# Patient Record
Sex: Female | Born: 1995
Health system: Southern US, Community
[De-identification: ages and names within clinical notes are randomized; demographics above are authoritative.]

## PROBLEM LIST (undated history)

## (undated) DIAGNOSIS — Z8489 Family history of other specified conditions: Secondary | ICD-10-CM

## (undated) DIAGNOSIS — E282 Polycystic ovarian syndrome: Secondary | ICD-10-CM

## (undated) DIAGNOSIS — J45909 Unspecified asthma, uncomplicated: Secondary | ICD-10-CM

## (undated) HISTORY — PX: UPPER GI ENDOSCOPY: SHX6162

## (undated) HISTORY — PX: BACK SURGERY: SHX140

---

## 2015-02-10 ENCOUNTER — Emergency Department (HOSPITAL_COMMUNITY): Payer: Medicaid Other

## 2015-02-10 ENCOUNTER — Encounter (HOSPITAL_COMMUNITY): Payer: Self-pay

## 2015-02-10 ENCOUNTER — Emergency Department (HOSPITAL_COMMUNITY)
Admission: EM | Admit: 2015-02-10 | Discharge: 2015-02-11 | Disposition: A | Discharge status: Home / Self Care | Payer: Medicaid Other | Attending: Emergency Medicine | Admitting: Emergency Medicine

## 2015-02-10 DIAGNOSIS — W01198A Fall on same level from slipping, tripping and stumbling with subsequent striking against other object, initial encounter: Secondary | ICD-10-CM | POA: Insufficient documentation

## 2015-02-10 DIAGNOSIS — M25561 Pain in right knee: Secondary | ICD-10-CM

## 2015-02-10 DIAGNOSIS — Y9389 Activity, other specified: Secondary | ICD-10-CM | POA: Diagnosis not present

## 2015-02-10 DIAGNOSIS — Y998 Other external cause status: Secondary | ICD-10-CM | POA: Insufficient documentation

## 2015-02-10 DIAGNOSIS — J45909 Unspecified asthma, uncomplicated: Secondary | ICD-10-CM | POA: Diagnosis not present

## 2015-02-10 DIAGNOSIS — Y9289 Other specified places as the place of occurrence of the external cause: Secondary | ICD-10-CM | POA: Diagnosis not present

## 2015-02-10 DIAGNOSIS — L03011 Cellulitis of right finger: Secondary | ICD-10-CM | POA: Insufficient documentation

## 2015-02-10 DIAGNOSIS — S8991XA Unspecified injury of right lower leg, initial encounter: Secondary | ICD-10-CM | POA: Diagnosis present

## 2015-02-10 HISTORY — DX: Unspecified asthma, uncomplicated: J45.909

## 2015-02-10 NOTE — ED Notes (Addendum)
Pt presents with c/o right knee injury. Pt reports her knee has been hurting for three to four days but today she fell and now her knee is very painful. Ambulatory to triage. Pt also reports right middle finger pain and swelling, denies any injury.

## 2015-02-11 MED ORDER — IBUPROFEN 800 MG PO TABS: 800.0000 mg | ORAL_TABLET | Freq: Three times a day (TID) | ORAL | Status: DC

## 2015-02-11 MED ORDER — HYDROCODONE-ACETAMINOPHEN 5-325 MG PO TABS: 1.0000 | ORAL_TABLET | ORAL | Status: DC | PRN

## 2015-02-11 MED ORDER — CEPHALEXIN 500 MG PO CAPS: 500.0000 mg | ORAL_CAPSULE | Freq: Four times a day (QID) | ORAL | Status: DC

## 2015-02-11 MED ORDER — LIDOCAINE HCL 2 % IJ SOLN
10.0000 mL | Freq: Once | INTRAMUSCULAR | Status: AC
  Administered 2015-02-11: 10 mL via INTRADERMAL
  Filled 2015-02-11: qty 20

## 2015-02-11 NOTE — ED Notes (Signed)
Pt reports R knee pain x 4 days, became worse yesterday when she fell.  Pt also c/o R middle swelling and pain.

## 2015-02-11 NOTE — ED Provider Notes (Signed)
CSN: [id number]     Arrival date & time 02/10/15  2310 History   First MD Initiated Contact with Patient 02/11/15 0009     Chief Complaint  Patient presents with  . Knee Injury  . Pain in finger      (Consider location/radiation/quality/duration/timing/severity/associated sxs/prior Treatment) Patient is a 19 y.o. female presenting with knee pain and hand pain. The history is provided by the patient. No language interpreter was used.  Knee Pain Location:  Knee Knee location:  R knee Associated symptoms: no fever   Associated symptoms comment:  Right knee pain and swelling for several days, made worse today after mechanical fall on to right knee and foot. No history of injury causing initial pain. No similar symptoms previously.  Hand Pain This is a new problem. Pertinent negatives include no chills or fever. Associated symptoms comments: Right middle finger pain and swelling. Pain started around nail base, without drainage or redness, now affecting entire distal finger. No known injury..    Past Medical History  Diagnosis Date  . Asthma    History reviewed. No pertinent past surgical history. No family history on file. History  Substance Use Topics  . Smoking status: Never Smoker   . Smokeless tobacco: Not on file  . Alcohol Use: No   OB History    No data available     Review of Systems  Constitutional: Negative for fever and chills.  Musculoskeletal:       See HPI.  Skin: Negative.  Negative for color change.  Neurological: Negative.       Allergies  Review of patient's allergies indicates no known allergies.  Home Medications   Prior to Admission medications   Not on File   BP 140/107 mmHg  Pulse 99  Temp(Src) 98.5 F (36.9 C) (Oral)  Resp 12  SpO2 100%  LMP 02/10/2015 Physical Exam  Constitutional: She is oriented to person, place, and time. She appears well-developed and well-nourished.  Neck: Normal range of motion.  Pulmonary/Chest: Effort  normal.  Musculoskeletal:  Right knee without significant swelling. No redness or warmth. Increased pain with any range of motion. Ambulatory. Right middle finger has swelling to cuticle without redness or drainage. Tender. Pad of finger soft, tender, without redness.   Neurological: She is alert and oriented to person, place, and time.  Skin: Skin is warm and dry.    ED Course  Procedures (including critical care time) Labs Review Labs Reviewed - No data to display  Imaging Review Dg Knee Complete 4 Views Right  02/11/2015   CLINICAL DATA:  Larey SeatFell down stairs 3-4 days ago, anterior knee and finger pain.  EXAM: RIGHT KNEE - COMPLETE 4+ VIEW  COMPARISON:  None.  FINDINGS: There is no evidence of fracture, dislocation, or joint effusion. There is no evidence of arthropathy or other focal bone abnormality. Soft tissues are unremarkable.  IMPRESSION: Negative.   Electronically Signed   By: Awilda Metroourtnay  Bloomer   On: 02/11/2015 00:52   Dg Finger Middle Right  02/11/2015   CLINICAL DATA:  Larey SeatFell down stairs 3-4 days ago, knee and finger pain.  EXAM: RIGHT MIDDLE FINGER 2+V  COMPARISON:  None.  FINDINGS: There is no evidence of fracture or dislocation. There is no evidence of arthropathy or other focal bone abnormality. Soft tissues are unremarkable.  IMPRESSION: Negative.   Electronically Signed   By: Awilda Metroourtnay  Bloomer   On: 02/11/2015 00:52     EKG Interpretation None     Procedure: Right  middle finger numbed via digital block with 2% lidocaine plain. #11 blade used to open paronychia by cuticle puncture.  MDM   Final diagnoses:  None    1. Right knee pain 2. Right paronychia  Will refer to orthopedics for further management of knee pain. Knee immobilizer attempted, however, body habitus may prevent maximum benefit.   Resources given for primary care providers. Return precautions regarding finger infection provided.  Elpidio AnisShari Marletta Bousquet, PA-C 02/11/15 0128  Marisa Severinlga Otter, MD 02/11/15 [id number] [phone]

## 2015-02-11 NOTE — Discharge Instructions (Signed)
Paronychia Paronychia is an inflammatory reaction involving the folds of the skin surrounding the fingernail. This is commonly caused by an infection in the skin around a nail. The most common cause of paronychia is frequent wetting of the hands (as seen with bartenders, food servers, nurses or others who wet their hands). This makes the skin around the fingernail susceptible to infection by bacteria (germs) or fungus. Other predisposing factors are:  Aggressive manicuring.  Nail biting.  Thumb sucking. The most common cause is a staphylococcal (a type of germ) infection, or a fungal (Candida) infection. When caused by a germ, it usually comes on suddenly with redness, swelling, pus and is often painful. It may get under the nail and form an abscess (collection of pus), or form an abscess around the nail. If the nail itself is infected with a fungus, the treatment is usually prolonged and may require oral medicine for up to one year. Your caregiver will determine the length of time treatment is required. The paronychia caused by bacteria (germs) may largely be avoided by not pulling on hangnails or picking at cuticles. When the infection occurs at the tips of the finger it is called felon. When the cause of paronychia is from the herpes simplex virus (HSV) it is called herpetic whitlow. TREATMENT  When an abscess is present treatment is often incision and drainage. This means that the abscess must be cut open so the pus can get out. When this is done, the following home care instructions should be followed. HOME CARE INSTRUCTIONS   It is important to keep the affected fingers very dry. Rubber or plastic gloves over cotton gloves should be used whenever the hand must be placed in water.  Keep wound clean, dry and dressed as suggested by your caregiver between warm soaks or warm compresses.  Soak in warm water for fifteen to twenty minutes three to four times per day for bacterial infections. Fungal  infections are very difficult to treat, so often require treatment for long periods of time.  For bacterial (germ) infections take antibiotics (medicine which kill germs) as directed and finish the prescription, even if the problem appears to be solved before the medicine is gone.  Only take over-the-counter or prescription medicines for pain, discomfort, or fever as directed by your caregiver. SEEK IMMEDIATE MEDICAL CARE IF:  You have redness, swelling, or increasing pain in the wound.  You notice pus coming from the wound.  You have a fever.  You notice a bad smell coming from the wound or dressing. Document Released: 03/09/2001 Document Revised: 12/06/2011 Document Reviewed: 11/08/2008 [facility] Patient Information 2015 [employer] [place], [place]. This information is not intended to replace advice given to you by your health care provider. Make sure you discuss any questions you have with your health care provider. Knee Pain The knee is the complex joint between your thigh and your lower leg. It is made up of bones, tendons, ligaments, and cartilage. The bones that make up the knee are:  The femur in the thigh.  The tibia and fibula in the lower leg.  The patella or kneecap riding in the groove on the lower femur. CAUSES  Knee pain is a common complaint with many causes. A few of these causes are:  Injury, such as:  A ruptured ligament or tendon injury.  Torn cartilage.  Medical conditions, such as:  Gout  Arthritis  Infections  Overuse, over training, or overdoing a physical activity. Knee pain can be minor or severe. Knee  pain can accompany debilitating injury. Minor knee problems often respond well to self-care measures or get well on their own. More serious injuries may need medical intervention or even surgery. SYMPTOMS The knee is complex. Symptoms of knee problems can vary widely. Some of the problems are:  Pain with movement and weight bearing.  Swelling and  tenderness.  Buckling of the knee.  Inability to straighten or extend your knee.  Your knee locks and you cannot straighten it.  Warmth and redness with pain and fever.  Deformity or dislocation of the kneecap. DIAGNOSIS  Determining what is wrong may be very straight forward such as when there is an injury. It can also be challenging because of the complexity of the knee. Tests to make a diagnosis may include:  Your caregiver taking a history and doing a physical exam.  Routine X-rays can be used to rule out other problems. X-rays will not reveal a cartilage tear. Some injuries of the knee can be diagnosed by:  Arthroscopy a surgical technique by which a small video camera is inserted through tiny incisions on the sides of the knee. This procedure is used to examine and repair internal knee joint problems. Tiny instruments can be used during arthroscopy to repair the torn knee cartilage (meniscus).  Arthrography is a radiology technique. A contrast liquid is directly injected into the knee joint. Internal structures of the knee joint then become visible on X-ray film.  An MRI scan is a non X-ray radiology procedure in which magnetic fields and a computer produce two- or three-dimensional images of the inside of the knee. Cartilage tears are often visible using an MRI scanner. MRI scans have largely replaced arthrography in diagnosing cartilage tears of the knee.  Blood work.  Examination of the fluid that helps to lubricate the knee joint (synovial fluid). This is done by taking a sample out using a needle and a syringe. TREATMENT The treatment of knee problems depends on the cause. Some of these treatments are:  Depending on the injury, proper casting, splinting, surgery, or physical therapy care will be needed.  Give yourself adequate recovery time. Do not overuse your joints. If you begin to get sore during workout routines, back off. Slow down or do fewer repetitions.  For  repetitive activities such as cycling or running, maintain your strength and nutrition.  Alternate muscle groups. For example, if you are a weight lifter, work the upper body on one day and the lower body the next.  Either tight or weak muscles do not give the proper support for your knee. Tight or weak muscles do not absorb the stress placed on the knee joint. Keep the muscles surrounding the knee strong.  Take care of mechanical problems.  If you have flat feet, orthotics or special shoes may help. See your caregiver if you need help.  Arch supports, sometimes with wedges on the inner or outer aspect of the heel, can help. These can shift pressure away from the side of the knee most bothered by osteoarthritis.  A brace called an "unloader" brace also may be used to help ease the pressure on the most arthritic side of the knee.  If your caregiver has prescribed crutches, braces, wraps or ice, use as directed. The acronym for this is PRICE. This means protection, rest, ice, compression, and elevation.  Nonsteroidal anti-inflammatory drugs (NSAIDs), can help relieve pain. But if taken immediately after an injury, they may actually increase swelling. Take NSAIDs with food in your stomach.  Stop them if you develop stomach problems. Do not take these if you have a history of ulcers, stomach pain, or bleeding from the bowel. Do not take without your caregiver's approval if you have problems with fluid retention, heart failure, or kidney problems.  For ongoing knee problems, physical therapy may be helpful.  Glucosamine and chondroitin are over-the-counter dietary supplements. Both may help relieve the pain of osteoarthritis in the knee. These medicines are different from the usual anti-inflammatory drugs. Glucosamine may decrease the rate of cartilage destruction.  Injections of a corticosteroid drug into your knee joint may help reduce the symptoms of an arthritis flare-up. They may provide pain  relief that lasts a few months. You may have to wait a few months between injections. The injections do have a small increased risk of infection, water retention, and elevated blood sugar levels.  Hyaluronic acid injected into damaged joints may ease pain and provide lubrication. These injections may work by reducing inflammation. A series of shots may give relief for as long as 6 months.  Topical painkillers. Applying certain ointments to your skin may help relieve the pain and stiffness of osteoarthritis. Ask your pharmacist for suggestions. Many over the-counter products are approved for temporary relief of arthritis pain.  In some countries, doctors often prescribe topical NSAIDs for relief of chronic conditions such as arthritis and tendinitis. A review of treatment with NSAID creams found that they worked as well as oral medications but without the serious side effects. PREVENTION  Maintain a healthy weight. Extra pounds put more strain on your joints.  Get strong, stay limber. Weak muscles are a common cause of knee injuries. Stretching is important. Include flexibility exercises in your workouts.  Be smart about exercise. If you have osteoarthritis, chronic knee pain or recurring injuries, you may need to change the way you exercise. This does not mean you have to stop being active. If your knees ache after jogging or playing basketball, consider switching to swimming, water aerobics, or other low-impact activities, at least for a few days a week. Sometimes limiting high-impact activities will provide relief.  Make sure your shoes fit well. Choose footwear that is right for your sport.  Protect your knees. Use the proper gear for knee-sensitive activities. Use kneepads when playing volleyball or laying carpet. Buckle your seat belt every time you drive. Most shattered kneecaps occur in car accidents.  Rest when you are tired. SEEK MEDICAL CARE IF:  You have knee pain that is continual  and does not seem to be getting better.  SEEK IMMEDIATE MEDICAL CARE IF:  Your knee joint feels hot to the touch and you have a high fever. MAKE SURE YOU:   Understand these instructions.  Will watch your condition.  Will get help right away if you are not doing well or get worse. Document Released: 07/11/2007 Document Revised: 12/06/2011 Document Reviewed: 07/11/2007 [facility] Patient Information 2015 [place], [place]. This information is not intended to replace advice given to you by your health care provider. Make sure you discuss any questions you have with your health care provider.

## 2015-02-20 ENCOUNTER — Emergency Department (HOSPITAL_COMMUNITY)
Admission: EM | Admit: 2015-02-20 | Discharge: 2015-02-20 | Disposition: A | Discharge status: Home / Self Care | Payer: Medicaid Other | Attending: Emergency Medicine | Admitting: Emergency Medicine

## 2015-02-20 ENCOUNTER — Encounter (HOSPITAL_COMMUNITY): Payer: Self-pay | Admitting: Emergency Medicine

## 2015-02-20 DIAGNOSIS — M5431 Sciatica, right side: Secondary | ICD-10-CM | POA: Insufficient documentation

## 2015-02-20 DIAGNOSIS — L03012 Cellulitis of left finger: Secondary | ICD-10-CM | POA: Insufficient documentation

## 2015-02-20 DIAGNOSIS — M549 Dorsalgia, unspecified: Secondary | ICD-10-CM | POA: Diagnosis present

## 2015-02-20 DIAGNOSIS — M5441 Lumbago with sciatica, right side: Secondary | ICD-10-CM

## 2015-02-20 DIAGNOSIS — J45909 Unspecified asthma, uncomplicated: Secondary | ICD-10-CM | POA: Diagnosis not present

## 2015-02-20 MED ORDER — LIDOCAINE HCL (PF) 1 % IJ SOLN: 2.0000 mL | Freq: Once | INTRAMUSCULAR | Status: DC

## 2015-02-20 MED ORDER — LIDOCAINE HCL (PF) 1 % IJ SOLN
20.0000 mL | Freq: Once | INTRAMUSCULAR | Status: AC
  Administered 2015-02-20: 20 mL via INTRADERMAL

## 2015-02-20 MED ORDER — AMOXICILLIN-POT CLAVULANATE 875-125 MG PO TABS: 1.0000 | ORAL_TABLET | Freq: Two times a day (BID) | ORAL | Status: DC

## 2015-02-20 MED ORDER — NAPROXEN 500 MG PO TABS: 500.0000 mg | ORAL_TABLET | Freq: Two times a day (BID) | ORAL | Status: DC

## 2015-02-20 MED ORDER — METHOCARBAMOL 500 MG PO TABS: 500.0000 mg | ORAL_TABLET | Freq: Two times a day (BID) | ORAL | Status: DC

## 2015-02-20 MED ORDER — DIAZEPAM 5 MG PO TABS: 5.0000 mg | ORAL_TABLET | Freq: Two times a day (BID) | ORAL | Status: DC

## 2015-02-20 NOTE — ED Notes (Signed)
Pt reports severe low back pain at 0300. Acute onset, Denies trauma. Pt took Ultram at 0300 and Percocet at 0700. No relief of pain, but made her sleepy

## 2015-02-20 NOTE — Discharge Instructions (Signed)
Back Exercises °Back exercises help treat and prevent back injuries. The goal is to increase your strength in your belly (abdominal) and back muscles. These exercises can also help with flexibility. Start these exercises when told by your doctor. °HOME CARE °Back exercises include: °Pelvic Tilt. °· Lie on your back with your knees bent. Tilt your pelvis until the lower part of your back is against the floor. Hold this position 5 to 10 sec. Repeat this exercise 5 to 10 times. °Knee to Chest. °· Pull 1 knee up against your chest and hold for 20 to 30 seconds. Repeat this with the other knee. This may be done with the other leg straight or bent, whichever feels better. Then, pull both knees up against your chest. °Sit-Ups or Curl-Ups. °· Bend your knees 90 degrees. Start with tilting your pelvis, and do a partial, slow sit-up. Only lift your upper half 30 to 45 degrees off the floor. Take at least 2 to 3 seonds for each sit-up. Do not do sit-ups with your knees out straight. If partial sit-ups are difficult, simply do the above but with only tightening your belly (abdominal) muscles and holding it as told. °Hip-Lift. °· Lie on your back with your knees flexed 90 degrees. Push down with your feet and shoulders as you raise your hips 2 inches off the floor. Hold for 10 seconds, repeat 5 to 10 times. °Back Arches. °· Lie on your stomach. Prop yourself up on bent elbows. Slowly press on your hands, causing an arch in your low back. Repeat 3 to 5 times. °Shoulder-Lifts. °· Lie face down with arms beside your body. Keep hips and belly pressed to floor as you slowly lift your head and shoulders off the floor. °Do not overdo your exercises. Be careful in the beginning. Exercises may cause you some mild back discomfort. If the pain lasts for more than 15 minutes, stop the exercises until you see your doctor. Improvement with exercise for back problems is slow.  °Document Released: 10/16/2010 Document Revised: 12/06/2011  Document Reviewed: 07/15/2011 °ExitCare® Patient Information ©2015 [employer]. This information is not intended to replace advice given to you by your health care provider. Make sure you discuss any questions you have with your health care provider. ° °Emergency Department Resource Guide °1) Find a Doctor and Pay Out of Pocket °Although you won't have to find out who is covered by your insurance plan, it is a good idea to ask around and get recommendations. You will then need to call the office and see if the doctor you have chosen will accept you as a new patient and what types of options they offer for patients who are self-pay. Some doctors offer discounts or will set up payment plans for their patients who do not have insurance, but you will need to ask so you aren't surprised when you get to your appointment. ° °2) Contact Your Local Health Department °Not all health departments have doctors that can see patients for sick visits, but many do, so it is worth a call to see if yours does. If you don't know where your local health department is, you can check in your phone book. The CDC also has a tool to help you locate your state's health department, and many state websites also have listings of all of their local health departments. ° °3) Find a Walk-in Clinic °If your illness is not likely to be very severe or complicated, you may want to try a walk in   clinic. These are popping up all over the country in pharmacies, drugstores, and shopping centers. They're usually staffed by nurse practitioners or physician assistants that have been trained to treat common illnesses and complaints. They're usually fairly quick and inexpensive. However, if you have serious medical issues or chronic medical problems, these are probably not your best option. ° °No Primary Care Doctor: °- Call [facility] at  832-8000 - they can help you locate a primary care doctor that  accepts your insurance, provides certain services,  etc. °- Physician Referral Service- 1-800-533-3463 ° °Chronic Pain Problems: °Organization         Address  Phone   Notes  °[facility]  (336) 297-2271 Patients need to be referred by their primary care doctor.  ° °Medication Assistance: °Organization         Address  Phone   Notes  °[place] [facility] [address] °[place], [place] [address] (336) 641-8030 --Must be a resident of [place] °-- Must have NO insurance coverage whatsoever (no Medicaid/ Medicare, etc.) °-- The pt. MUST have a primary care doctor that directs their care regularly and follows them in the community °  °[facility]  (866) 331-1348   °[employer]  (888) 892-1162   ° °Agencies that provide inexpensive medical care: °Organization         Address  Phone   Notes  °[facility]  (336) 832-8035   °[facility]    (336) 832-7272   °[facility] [address] °[place], [place] [address] (336) 832-4777   °[facility] [address], °[place] (336) 271-4999   °[facility]    (336) 373-0678   °[facility]    (336) 272-1050   °[facility] ° [address], [place] Phone:  (336) 832-4444, Fax:  (336) 832-4440 Hours of Operation:  9 am - 6 pm, M-F.  Also accepts Medicaid/Medicare and self-pay.  °[facility] ° [address], [place] Phone: (336) 832-3150, Fax: (336) 832-3151. Hours of Operation:  8:30 am - 5:30 pm, M-F.  Also accepts Medicaid and self-pay.  °[facility] [place] [address], [place] Phone: (336) 878-6027   °[facility] [address], [place], [place] (336)723-1848, Ext. 123 Mondays & Thursdays: 7-9 AM.  First 15 patients are seen on a first come, first serve basis. °  ° °Medicaid-accepting [place] Providers: ° °Organization         Address  Phone   Notes  °[facility] [address], [place] (336) 641-2100 Also accepts self-pay patients.  °[facility] [address], [place] ° (336) 856-9996   °[facility] [address], [place] (336) 288-8857   °[facility] [address] [place] [address], [place] (336) 299-7000   °Veita Bland [address], [place]  ° (336) 373-1557 Only accepts  Access Medicaid patients after they have their name applied to their card.  ° °Self-Pay (no insurance) in [place]: ° °Organization         Address  Phone   Notes  °Sickle Cell Patients, [facility] [address], [place] (336) 832-1970   °[facility] [address],  (336) 832-4400   °[facility] ° [address], [place] (336) 992-4800   °[facility]/Dr. Osei-Bonsu ° [address] [place] [address], [place] or [address], [place] (336) 841-8500 Phone number for both [place] and [place] locations is the same.  °[facility] [address], [place] (336) 299-0000   °[facility] [address] [place] [address], [place] or [address] (336) 852-7530 °(336) 878-2260   °[facility] [address], [place] (336) 350-1642, phone; (336) 294-5005, fax Sees patients 1st and 3rd Saturday of every month.  Must not qualify for public or private insurance (i.e. Medicaid, Medicare, [place] Health Choice, Veterans' Benefits) • Household income should be no more than 200% of the poverty level •The clinic cannot treat you if you are pregnant or think you are pregnant • Sexually transmitted diseases are not treated at the clinic.  ° ° °Dental Care: °Organization         Address  Phone  Notes  °[facility] [address], [place] (336) 641-6152 Accepts children up to  age 21 who are enrolled in Medicaid or [place] Health Choice; pregnant women with a Medicaid card; and children who have applied for Medicaid or [place] Health Choice, but were declined, whose parents can pay a reduced fee at time of service.  °[facility] [place]  [address], [place] (336) 641-7733 Accepts children up to age 21 who are enrolled in Medicaid or [place] Health Choice; pregnant women with a Medicaid card; and children who have applied for Medicaid or [place] Health Choice, but were declined, whose parents can pay a reduced fee at time of service.  °[facility] ° [address], [place] (336) 641-4533 Patients are seen by appointment only. Walk-ins are not accepted. [facility] will see patients 18 years of age and older. °Monday - Tuesday (8am-5pm) °Most Wednesdays (8:30-5pm) °$30 per visit, cash only  °[facility] ° [address], [place] (336) 641-4533 Patients are seen by appointment only. Walk-ins are not accepted. [facility] will see patients 18 years of age and older. °One Wednesday Evening (Monthly: Volunteer Based).  $30 per visit, cash only  °[facility]  (919) 537-3737 for adults; Children under age 4, call Graduate Pediatric Dentistry at (919) [phone]. Children aged 4-14, please call (919) [phone] to request a pediatric application. ° Dental services are provided in all areas of dental care including fillings, crowns and bridges, complete and partial dentures, implants, gum treatment, root canals, and extractions. Preventive care is also provided. Treatment is provided to both adults and children. °Patients are selected via a lottery and there is often a waiting list. °  °[facility] [address], °[place] ° (336) 763-8833 [email or web address] °  °[facility] [address], [place], [place] (336)723-1848, Ext. 123 Second and Fourth Thursday of  each month, opens at 6:30 AM; Clinic ends at 9 AM.  Patients are seen on a first-come first-served basis, and a limited number are seen during each clinic.  ° °[facility] ° [address], [place], [place] (336) 723-7904   Eligibility Requirements °You must have lived in [place], or [place] counties for at least the last three months. °  You cannot be eligible for state or federal sponsored healthcare insurance, including   Veterans Administration, Medicaid, or Medicare. °  You generally cannot be eligible for healthcare insurance through your employer.  °  How to apply: °Eligibility screenings are held every Tuesday and Wednesday afternoon from 1:00 pm until 4:00 pm. You do not need an appointment for the interview!  °[facility] [address], [place], [place] 336-631-2330   °[facility]  336-342-8273   °[facility]  336-703-3100   °[facility]  336-570-6415   ° °Behavioral Health Resources in the Community: °Intensive Outpatient Programs °Organization         Address  Phone  Notes  °[facility] [address], [place], [place] 336-878-6098   °[facility] [address], [place], [place] 336-832-9800   °[facility] [address], [place], [place] ° 336-882-2125   °[facility] [address],  °[place], [place] 1-800-853-5163 or 336-641-4981   °Substance Abuse Resources °Organization         Address  Phone  Notes  °[facility]  336-882-2125   °[facility]  336-784-9470   °[facility]  336-285-9073   °[facility]  336-845-3988   °Residential & Outpatient Substance Abuse Program  1-800-659-3381   °Psychological Services °Organization         Address  Phone  Notes  °[facility]  336- 832-9600   °[facility]  336- 378-7881   °[facility] [address],  [place] 1-800-853-5163 or 336-641-4981   ° °Mobile Crisis Teams °Organization         Address  Phone  Notes  °[facility], Mobile Crisis Care Unit  1-877-626-1772   °Assertive °[facility] ° [address] [place], [place] 336-834-9664   °Sharon DeEsch [address] °[place] 336-554-5454   ° °Self-Help/Support Groups °Organization         Address  Phone             Notes  °[facility] - variety of support groups  336- 373-1402 Call for more information  °Narcotics Anonymous (NA), [facility] [address], °[place]  2 meetings at this location  ° °Residential Treatment Programs °Organization         Address  Phone  Notes  °[facility] [address],    °[place]  1-866-801-8205   °[facility] ° [address], [place], [place] 704-293-8524   °[facility] [address], [place] 336-845-3988 Admissions: 8am-3pm M-F  °[facility] [address]    °[place], [place] 336-841-1104   °[facility] [address], [place], [place] 336-379-7146   °[facility] [address]  °[place], [place] 336-285-9073   °[facility] Intensive Outpatient [address], [place], [place] 336-852-3033   °[facility] [address]  °[place], [place] 1-877-615-2722 or 336-784-9470   °[facility] [address] [place], [place] 336-227-7417 Accepts Medicaid  °[facility] [address]  ° [place] 1-800-659-3381 Substance Abuse/Addiction Treatment  ° °[place] Behavioral Health Resources °Organization         Address  Phone  Notes  °[facility]  (888) 581-9988   °Julie Brannon, PhD [address] [place], [place]   (336) 349-5553 or (336) 951-0000   °  [facility]   [address] °[place], [place] (336) 349-4454     °[facility] [address], [place], [place] (336) 342-8316 Insurance/Medicaid/sponsorship through [facility]  °[facility] [address]                                    [place], [place] (336) 342-8316 Therapy/tele-psych/case  °[facility] [address]  ° [place], [place] (336) 349-2233    °Dr. Arfeen  (336) 349-4544   °[facility] [place]  [employer] [place] [facility] 1) [address], [place] °2) [address], [place] °3)  [address], [place] (336) 349-3220 °(336) 342-7768 ° °(336) 342-8140   °[place] [facility] (336) 342-1394 or (336) 342-3537 (After Hours)    ° ° °

## 2015-02-20 NOTE — ED Notes (Signed)
Back pain continues at 10/10

## 2015-02-20 NOTE — ED Provider Notes (Signed)
CSN: [id number]     Arrival date & time 02/20/15  1231 History   First MD Initiated Contact with Patient 02/20/15 1245     Chief Complaint  Patient presents with  . Back Pain    8 hr hx oflow back pain     (Consider location/radiation/quality/duration/timing/severity/associated sxs/prior Treatment) HPI Mrs. Sarah Hatfield is a 19 year old female who presents to the ED for back pain that began early this morning. Her mom states that she gave 2 ibuprofen at 3 AM which did not help her pain. Then she gave her a Percocet 5-325 at 7 AM this morning. The patient states that it is difficult to move from a sitting to standing position. The pain is worsened with sitting for extended periods of time. Patient states she has had this several times in the past and the back pain usually lasts a week and resolves on its own. He should also complains of right middle finger paronychia. She states she was seen for this 4 days ago in the ED. She states that it has worsened with increased swelling and pain. She was given Keflex at that time. She states she has taken all of the anabiotic. Patient denies any fever or chills, recent injury, bowel or bladder incontinence or retention, any recent steroid-induced, or any IV drug use. Patient has no history of cancer. Past Medical History  Diagnosis Date  . Asthma    History reviewed. No pertinent past surgical history. Family History  Problem Relation Age of Onset  . Cancer Mother   . Asthma Mother   . Diabetes Other   . Hypertension Other    History  Substance Use Topics  . Smoking status: Never Smoker   . Smokeless tobacco: Not on file  . Alcohol Use: No   OB History    No data available     Review of Systems  Constitutional: Negative for fever.  Gastrointestinal: Negative for abdominal pain.  Genitourinary: Negative for difficulty urinating.  Neurological: Negative for weakness and numbness.      Allergies  Review of patient's allergies indicates no  known allergies.  Home Medications   Prior to Admission medications   Medication Sig Start Date End Date Taking? Authorizing Provider  ibuprofen (ADVIL,MOTRIN) 800 MG tablet Take 1 tablet (800 mg total) by mouth 3 (three) times daily. Patient taking differently: Take 800 mg by mouth every 6 (six) hours as needed for headache or moderate pain.  02/11/15  Yes Elpidio Anis, PA-C  oxyCODONE-acetaminophen (PERCOCET/ROXICET) 5-325 MG per tablet Take 1 tablet by mouth every 4 (four) hours as needed for severe pain.   Yes Historical Provider, MD  cephALEXin (KEFLEX) 500 MG capsule Take 1 capsule (500 mg total) by mouth 4 (four) times daily. Patient not taking: Reported on 02/20/2015 02/11/15   Elpidio Anis, PA-C  diazepam (VALIUM) 5 MG tablet Take 1 tablet (5 mg total) by mouth 2 (two) times daily. 02/20/15   Tamya Denardo Patel-Mills, PA-C  HYDROcodone-acetaminophen (NORCO/VICODIN) 5-325 MG per tablet Take 1-2 tablets by mouth every 4 (four) hours as needed. Patient not taking: Reported on 02/20/2015 02/11/15   Elpidio Anis, PA-C  methocarbamol (ROBAXIN) 500 MG tablet Take 1 tablet (500 mg total) by mouth 2 (two) times daily. 02/20/15   Addilyn Satterwhite Patel-Mills, PA-C  naproxen (NAPROSYN) 500 MG tablet Take 1 tablet (500 mg total) by mouth 2 (two) times daily. 02/20/15   Qasim Diveley Patel-Mills, PA-C   BP 145/108 mmHg  Pulse 105  Temp(Src) 98.2 F (36.8 C) (Oral)  Resp 16  SpO2 99%  LMP 02/10/2015 (Approximate) Physical Exam  Constitutional: She is oriented to person, place, and time. She appears well-developed and well-nourished.  HENT:  Head: Normocephalic.  Eyes: Conjunctivae are normal.  Neck: Neck supple.  Cardiovascular: Normal rate.   Pulmonary/Chest: Effort normal.  Musculoskeletal: Normal range of motion.  Obese. Patient is able to straight leg raise with the left leg to 90. Patient is able to straight leg raise with the right leg to 45. No midline tenderness. Bilateral paravertebral tenderness to  palpation. Right greater than left. Patient is able to ambulate in the room without difficulty.  Patient has right middle finger paronychia with tenderness to palpation. She is able to flex and extend the finger without difficulty.  Neurological: She is alert and oriented to person, place, and time.  Skin: Skin is warm and dry.    ED Course  Procedures (including critical care time) Labs Review Labs Reviewed - No data to display  INCISION AND DRAINAGE Performed by: Catha GosselinPatel-Mills, Adilenne [place] Consent: Verbal consent obtained. Risks and benefits: risks, benefits and alternatives were discussed Type:paronychia Body area: right middle finger Anesthesia: digital block Incision was made with a scalpel. Local anesthetic: lidocaine 1% without epinephrine Anesthetic total: 4 ml Complexity: simple Drainage: small white purulent drainage Drainage amount: 1cc Note: I used an 11 blade and made a .5 cm incision. No packing material was used.  Patient tolerance: Patient tolerated the procedure well with no immediate complications.    Imaging Review No results found.   EKG Interpretation None      MDM   Final diagnoses:  Acute back pain with sciatica, right  She presents for back pain that began early this morning. She denies any injury. She states that she has had this in the past and it has resolved on its own. I do not believe she needs imaging at this time. She denies any bowel or bladder incontinence or retention. She denies any IV drug use or recent use of stairs. I believe this is musculoskeletal related. I will prescribe Valium, naproxen, Robaxin. She can follow up with a PCP using the resource guide. Both mom and patient and agree with the plan. After discharging the patient mom states the patient has an infection at the tip of the middle finger on the right hand. She was recently prescribed Keflex for this but states it has not improved and in fact it has gotten worse. I will prescribe  her Augmentin. I have lanced the area and was able to remove small amounts of purulent drainage. The patient tolerated the procedure well.   Catha GosselinHanna Patel-Mills, PA-C 02/20/15 [address]  Ritvik Mczeal Patel-Mills, PA-C 02/20/15 1446  Azalia BilisKevin Campos, MD 02/20/15 [phone]

## 2015-03-28 ENCOUNTER — Encounter (HOSPITAL_COMMUNITY): Payer: Self-pay

## 2015-03-28 ENCOUNTER — Emergency Department (HOSPITAL_COMMUNITY)
Admission: EM | Admit: 2015-03-28 | Discharge: 2015-03-28 | Disposition: A | Discharge status: Home / Self Care | Payer: Medicaid Other | Attending: Emergency Medicine | Admitting: Emergency Medicine

## 2015-03-28 DIAGNOSIS — J45909 Unspecified asthma, uncomplicated: Secondary | ICD-10-CM | POA: Insufficient documentation

## 2015-03-28 DIAGNOSIS — Z23 Encounter for immunization: Secondary | ICD-10-CM | POA: Insufficient documentation

## 2015-03-28 DIAGNOSIS — Z791 Long term (current) use of non-steroidal anti-inflammatories (NSAID): Secondary | ICD-10-CM | POA: Insufficient documentation

## 2015-03-28 DIAGNOSIS — L089 Local infection of the skin and subcutaneous tissue, unspecified: Secondary | ICD-10-CM | POA: Diagnosis present

## 2015-03-28 DIAGNOSIS — Z79899 Other long term (current) drug therapy: Secondary | ICD-10-CM | POA: Insufficient documentation

## 2015-03-28 DIAGNOSIS — Z792 Long term (current) use of antibiotics: Secondary | ICD-10-CM | POA: Insufficient documentation

## 2015-03-28 DIAGNOSIS — E669 Obesity, unspecified: Secondary | ICD-10-CM | POA: Insufficient documentation

## 2015-03-28 DIAGNOSIS — IMO0001 Reserved for inherently not codable concepts without codable children: Secondary | ICD-10-CM

## 2015-03-28 DIAGNOSIS — L03011 Cellulitis of right finger: Secondary | ICD-10-CM | POA: Insufficient documentation

## 2015-03-28 MED ORDER — LIDOCAINE HCL 2 % IJ SOLN
10.0000 mL | Freq: Once | INTRAMUSCULAR | Status: AC
  Administered 2015-03-28: 200 mg via INTRADERMAL
  Filled 2015-03-28: qty 20

## 2015-03-28 MED ORDER — CEPHALEXIN 500 MG PO CAPS: 1000.0000 mg | ORAL_CAPSULE | Freq: Two times a day (BID) | ORAL | Status: DC

## 2015-03-28 MED ORDER — TETANUS-DIPHTH-ACELL PERTUSSIS 5-2.5-18.5 LF-MCG/0.5 IM SUSP
0.5000 mL | Freq: Once | INTRAMUSCULAR | Status: AC
  Administered 2015-03-28: 0.5 mL via INTRAMUSCULAR
  Filled 2015-03-28: qty 0.5

## 2015-03-28 MED ORDER — IBUPROFEN 800 MG PO TABS: 800.0000 mg | ORAL_TABLET | Freq: Three times a day (TID) | ORAL | Status: DC | PRN

## 2015-03-28 NOTE — ED Notes (Addendum)
Pt escorted to discharge window. Verbalized understanding discharge instructions. In no acute distress.  Pt sent home w/ additional Telfa and coband.

## 2015-03-28 NOTE — Discharge Instructions (Signed)

## 2015-03-28 NOTE — ED Notes (Signed)
Pt c/o R middle digit swelling and pain x 2 days.  Pain score 8/10.  [facility] and area had to be drained.  Sts she took all of the prescribed antibiotics.  Sts it went away, but has come back.

## 2015-03-28 NOTE — ED Provider Notes (Signed)
CSN: [id number]     Arrival date & time 03/28/15  1047 History   First MD Initiated Contact with Patient 03/28/15 1050     No chief complaint on file.    (Consider location/radiation/quality/duration/timing/severity/associated sxs/prior Treatment) HPI   19 year old female presents for evaluation of right middle finger infection. Patient states for the past 2-3 month she has had recurrent infection around her nail bed consistence with a paronychia. She has been seen twice for this condition and has had I&D with improvement but infection returns.  She has noticed increase swelling for the past several weeks with sharp throbbing 8/10 pain worsening with palpation. She denies fever, chills, or numbness. She did take all the antibiotic prescribed. She is not up-to-date with tetanus. She has no other complaint. Pt is RHD.  Past Medical History  Diagnosis Date  . Asthma    No past surgical history on file. Family History  Problem Relation Age of Onset  . Cancer Mother   . Asthma Mother   . Diabetes Other   . Hypertension Other    History  Substance Use Topics  . Smoking status: Never Smoker   . Smokeless tobacco: Not on file  . Alcohol Use: No   OB History    No data available     Review of Systems  Constitutional: Negative for fever.  Skin: Positive for wound.      Allergies  Review of patient's allergies indicates no known allergies.  Home Medications   Prior to Admission medications   Medication Sig Start Date End Date Taking? Authorizing Provider  amoxicillin-clavulanate (AUGMENTIN) 875-125 MG per tablet Take 1 tablet by mouth every 12 (twelve) hours. 02/20/15   Hanna Patel-Mills, PA-C  cephALEXin (KEFLEX) 500 MG capsule Take 1 capsule (500 mg total) by mouth 4 (four) times daily. Patient not taking: Reported on 02/20/2015 02/11/15   Elpidio Anis, PA-C  diazepam (VALIUM) 5 MG tablet Take 1 tablet (5 mg total) by mouth 2 (two) times daily. 02/20/15   Hanna Patel-Mills, PA-C   HYDROcodone-acetaminophen (NORCO/VICODIN) 5-325 MG per tablet Take 1-2 tablets by mouth every 4 (four) hours as needed. Patient not taking: Reported on 02/20/2015 02/11/15   Elpidio Anis, PA-C  ibuprofen (ADVIL,MOTRIN) 800 MG tablet Take 1 tablet (800 mg total) by mouth 3 (three) times daily. Patient taking differently: Take 800 mg by mouth every 6 (six) hours as needed for headache or moderate pain.  02/11/15   Elpidio Anis, PA-C  methocarbamol (ROBAXIN) 500 MG tablet Take 1 tablet (500 mg total) by mouth 2 (two) times daily. 02/20/15   Hanna Patel-Mills, PA-C  naproxen (NAPROSYN) 500 MG tablet Take 1 tablet (500 mg total) by mouth 2 (two) times daily. 02/20/15   Hanna Patel-Mills, PA-C  oxyCODONE-acetaminophen (PERCOCET/ROXICET) 5-325 MG per tablet Take 1 tablet by mouth every 4 (four) hours as needed for severe pain.    Historical Provider, MD   There were no vitals taken for this visit. Physical Exam  Constitutional: She appears well-developed and well-nourished. No distress.  Obese African-American female appears to be in no acute distress.  HENT:  Head: Atraumatic.  Eyes: Conjunctivae are normal.  Neck: Neck supple.  Neurological: She is alert.  Skin: No rash noted.  Right middle finger. Induration and fluctuance noted at  medial cuticle nail fold with exquisite tenderness to palpation. Pad of finger is not involved.  Brisk cap refill.  Psychiatric: She has a normal mood and affect.  Nursing note and vitals reviewed.   ED  Course  Procedures (including critical care time)  11:01 AM Patient has recurrent paronychia to right middle finger amenable for I&D. Additional instruction includes soak, avoid nail biting and to f/u with hand specialist if sxs return. tdap given.    INCISION AND DRAINAGE Performed by: Fayrene HelperRAN,Kalib Bhagat Consent: Verbal consent obtained. Risks and benefits: risks, benefits and alternatives were discussed Type: abscess  Body area: R middle finger, medial cuticle nail  fold  Anesthesia: digital nerve block  Incision was made with a scalpel.  Local anesthetic: lidocaine 2% wo epinephrine  Anesthetic total: 6 ml  Complexity: complex Blunt dissection to break up loculations  Drainage: purulent  Drainage amount: small  Packing material: 1/4 in iodoform gauze  Patient tolerance: Patient tolerated the procedure well with no immediate complications.     Labs Review Labs Reviewed - No data to display  Imaging Review No results found.   EKG Interpretation None      MDM   Final diagnoses:  Paronychia of third finger of right hand    BP 142/86 mmHg  Pulse 97  Temp(Src) 98.5 F (36.9 C) (Oral)  Resp 16  SpO2 100%     Fayrene HelperBowie Zeke Aker, PA-C 03/28/15 1131  Doug SouSam Jacubowitz, MD 03/28/15 1656

## 2015-06-27 ENCOUNTER — Emergency Department (HOSPITAL_COMMUNITY)
Admission: EM | Admit: 2015-06-27 | Discharge: 2015-06-27 | Disposition: A | Discharge status: Home / Self Care | Payer: Medicaid Other | Attending: Emergency Medicine | Admitting: Emergency Medicine

## 2015-06-27 ENCOUNTER — Encounter (HOSPITAL_COMMUNITY): Payer: Self-pay | Admitting: Emergency Medicine

## 2015-06-27 DIAGNOSIS — Z79899 Other long term (current) drug therapy: Secondary | ICD-10-CM | POA: Diagnosis not present

## 2015-06-27 DIAGNOSIS — M545 Low back pain, unspecified: Secondary | ICD-10-CM

## 2015-06-27 DIAGNOSIS — J45909 Unspecified asthma, uncomplicated: Secondary | ICD-10-CM | POA: Diagnosis not present

## 2015-06-27 DIAGNOSIS — M62830 Muscle spasm of back: Secondary | ICD-10-CM | POA: Insufficient documentation

## 2015-06-27 MED ORDER — CYCLOBENZAPRINE HCL 10 MG PO TABS
10.0000 mg | ORAL_TABLET | Freq: Two times a day (BID) | ORAL | Status: DC | PRN
Start: 2015-06-27 — End: 2015-09-16

## 2015-06-27 MED ORDER — TRAMADOL HCL 50 MG PO TABS: 50.0000 mg | ORAL_TABLET | Freq: Four times a day (QID) | ORAL | Status: DC | PRN

## 2015-06-27 NOTE — ED Notes (Signed)
Pt from home c/p low back pain. Denies recent injury or urinary symptoms. She has been taking old prescriptions of muscle relaxants without relief.

## 2015-06-27 NOTE — ED Provider Notes (Signed)
CSN: [id number]     Arrival date & time 06/27/15  2057 History   By signing my name below, I, Sarah Hatfield, attest that this documentation has been prepared under the direction and in the presence of Emilia Beck, PA-C Electronically Signed: Jarvis Hatfield, ED Scribe. 06/27/2015. 10:21 PM.    Chief Complaint  Patient presents with  . Back Pain    The history is provided by the patient. No language interpreter was used.    HPI Comments: Sarah Hatfield is a 19 y.o. female who presents to the Emergency Department complaining of constant, lower back pain onset 3 days ago. Pt denies any recent injury. She endorses she has had this problem in the past and has been prescribed Ibuprofen and Robaxin; which she has been taking with no significant relief. Pt has been using a heating pad with no relief. She states the pain is exacerbated with movement and standing from sitting position. Pt is ambulatory without difficulty. She denies any urinary symptoms, numbness, weakness or bowel incontinence.   Past Medical History  Diagnosis Date  . Asthma    History reviewed. No pertinent past surgical history. Family History  Problem Relation Age of Onset  . Cancer Mother   . Asthma Mother   . Diabetes Other   . Hypertension Other    Social History  Substance Use Topics  . Smoking status: Never Smoker   . Smokeless tobacco: None  . Alcohol Use: No   OB History    No data available     Review of Systems  Gastrointestinal: Negative for bowel incontinence.  Genitourinary: Negative for bladder incontinence, dysuria, hematuria, flank pain and difficulty urinating.  Musculoskeletal: Positive for back pain. Negative for gait problem.  Neurological: Negative for weakness and numbness.  All other systems reviewed and are negative.     Allergies  Review of patient's allergies indicates no known allergies.  Home Medications   Prior to Admission medications   Medication Sig Start Date  End Date Taking? Authorizing Provider  ibuprofen (ADVIL,MOTRIN) 800 MG tablet Take 1 tablet (800 mg total) by mouth every 8 (eight) hours as needed for moderate pain. 03/28/15  Yes Fayrene Helper, PA-C  methocarbamol (ROBAXIN) 500 MG tablet Take 1 tablet (500 mg total) by mouth 2 (two) times daily. 02/20/15  Yes Hanna Patel-Mills, PA-C  amoxicillin-clavulanate (AUGMENTIN) 875-125 MG per tablet Take 1 tablet by mouth every 12 (twelve) hours. Patient not taking: Reported on 06/27/2015 02/20/15   Catha Gosselin, PA-C  cephALEXin (KEFLEX) 500 MG capsule Take 2 capsules (1,000 mg total) by mouth 2 (two) times daily. Patient not taking: Reported on 06/27/2015 03/28/15   Fayrene Helper, PA-C  diazepam (VALIUM) 5 MG tablet Take 1 tablet (5 mg total) by mouth 2 (two) times daily. Patient not taking: Reported on 06/27/2015 02/20/15   Catha Gosselin, PA-C  HYDROcodone-acetaminophen (NORCO/VICODIN) 5-325 MG per tablet Take 1-2 tablets by mouth every 4 (four) hours as needed. Patient not taking: Reported on 06/27/2015 02/11/15   Elpidio Anis, PA-C  naproxen (NAPROSYN) 500 MG tablet Take 1 tablet (500 mg total) by mouth 2 (two) times daily. Patient not taking: Reported on 06/27/2015 02/20/15   Catha Gosselin, PA-C   Triage Vitals: BP 116/92 mmHg  Pulse 76  Temp(Src) 98.3 F (36.8 C)  Resp 18  SpO2 99%  LMP 06/16/2015  Physical Exam  Constitutional: She is oriented to person, place, and time. She appears well-developed and well-nourished. No distress.  HENT:  Head: Normocephalic and atraumatic.  Eyes: Conjunctivae and EOM are normal.  Neck: Neck supple. No tracheal deviation present.  Cardiovascular: Normal rate and regular rhythm.  Exam reveals no gallop and no friction rub.   No murmur heard. Pulmonary/Chest: Effort normal and breath sounds normal. No respiratory distress. She has no wheezes. She has no rales. She exhibits no tenderness.  Abdominal: Soft. She exhibits no distension. There is no tenderness.   Musculoskeletal: Normal range of motion.       Lumbar back: She exhibits tenderness.  No midline spine TTP of lumbar spine Left lumbar paraspinal TTP  Neurological: She is alert and oriented to person, place, and time.  Speech is goal-oriented. Moves limbs without ataxia.   Skin: Skin is warm and dry.  Psychiatric: She has a normal mood and affect. Her behavior is normal.  Nursing note and vitals reviewed.   ED Course  Procedures (including critical care time)  DIAGNOSTIC STUDIES: Oxygen Saturation is 99% on RA, normal by my interpretation.    COORDINATION OF CARE:  10:28 PM- Will d/c pt home with Tramadol and Flexeril and advised to continue the heating pad and stretching to relieve muscle tightness. Pt requested a work note because she has to stand for 6 hours a day at her job; will provide pt with work note. Pt advised of plan for treatment and pt agrees.   Labs Review Labs Reviewed - No data to display  Imaging Review No results found. I have personally reviewed and evaluated these images and lab results as part of my medical decision-making.   EKG Interpretation None      MDM   Final diagnoses:  Left-sided low back pain without sciatica   Patient having muscle spasm of left lumbar paraspinal muscles. No bladder/bowel incontinence or saddle paresthesias. Patient will be discharged with Flexeril and Tramadol. No known injury.   I personally performed the services described in this documentation, which was scribed in my presence. The recorded information has been reviewed and is accurate.     Erice Ahles SzekalEmilia Beck1/16 0015  Richardean Canal, MD 07/01/15 [phone]

## 2015-06-27 NOTE — Discharge Instructions (Signed)
Take tramadol as needed for pain. Take Flexeril as needed for muscle spasm. Refer to attached documents for more information.

## 2015-07-30 ENCOUNTER — Emergency Department (HOSPITAL_COMMUNITY)
Admission: EM | Admit: 2015-07-30 | Discharge: 2015-07-30 | Disposition: A | Discharge status: Home / Self Care | Payer: Medicaid Other | Attending: Emergency Medicine | Admitting: Emergency Medicine

## 2015-07-30 ENCOUNTER — Encounter (HOSPITAL_COMMUNITY): Payer: Self-pay | Admitting: Emergency Medicine

## 2015-07-30 DIAGNOSIS — J45909 Unspecified asthma, uncomplicated: Secondary | ICD-10-CM | POA: Diagnosis not present

## 2015-07-30 DIAGNOSIS — L03211 Cellulitis of face: Secondary | ICD-10-CM | POA: Diagnosis not present

## 2015-07-30 DIAGNOSIS — R220 Localized swelling, mass and lump, head: Secondary | ICD-10-CM | POA: Diagnosis present

## 2015-07-30 MED ORDER — LIDOCAINE-EPINEPHRINE (PF) 2 %-1:200000 IJ SOLN
10.0000 mL | Freq: Once | INTRAMUSCULAR | Status: AC
  Administered 2015-07-30: 10 mL
  Filled 2015-07-30: qty 20

## 2015-07-30 MED ORDER — SULFAMETHOXAZOLE-TRIMETHOPRIM 800-160 MG PO TABS
1.0000 | ORAL_TABLET | Freq: Once | ORAL | Status: AC
  Administered 2015-07-30: 1 via ORAL
  Filled 2015-07-30: qty 1

## 2015-07-30 MED ORDER — OXYCODONE-ACETAMINOPHEN 5-325 MG PO TABS
2.0000 | ORAL_TABLET | Freq: Once | ORAL | Status: AC
  Administered 2015-07-30: 2 via ORAL
  Filled 2015-07-30: qty 2

## 2015-07-30 MED ORDER — CEPHALEXIN 500 MG PO CAPS: 1000.0000 mg | ORAL_CAPSULE | Freq: Two times a day (BID) | ORAL | Status: DC

## 2015-07-30 MED ORDER — SULFAMETHOXAZOLE-TRIMETHOPRIM 800-160 MG PO TABS: 1.0000 | ORAL_TABLET | Freq: Two times a day (BID) | ORAL | Status: AC

## 2015-07-30 MED ORDER — IBUPROFEN 800 MG PO TABS: 800.0000 mg | ORAL_TABLET | Freq: Three times a day (TID) | ORAL | Status: DC

## 2015-07-30 MED ORDER — LIDOCAINE HCL (PF) 1 % IJ SOLN
INTRAMUSCULAR | Status: AC
  Administered 2015-07-30: 2.1 mL
  Filled 2015-07-30: qty 5

## 2015-07-30 MED ORDER — CEFTRIAXONE SODIUM 1 G IJ SOLR
1.0000 g | Freq: Once | INTRAMUSCULAR | Status: AC
  Administered 2015-07-30: 1 g via INTRAMUSCULAR
  Filled 2015-07-30: qty 10

## 2015-07-30 MED ORDER — OXYCODONE-ACETAMINOPHEN 5-325 MG PO TABS: 2.0000 | ORAL_TABLET | ORAL | Status: DC | PRN

## 2015-07-30 MED ORDER — IBUPROFEN 800 MG PO TABS
800.0000 mg | ORAL_TABLET | Freq: Once | ORAL | Status: AC
  Administered 2015-07-30: 800 mg via ORAL
  Filled 2015-07-30: qty 1

## 2015-07-30 NOTE — ED Notes (Signed)
Pt c/o swelling around her left eye that has been going on for past 3-4 days.  Pt states that does have pain in left side of face.  Pt states that she has been using warm rags to help with pain and swelling.  Pt states that she did have a bump on face but denies popping it.

## 2015-07-30 NOTE — ED Notes (Signed)
Verbalized understanding discharge instructions. In no acute distress.   

## 2015-07-30 NOTE — Discharge Instructions (Signed)
Cellulitis Cellulitis is an infection of the skin and the tissue beneath it. The infected area is usually red and tender. Cellulitis occurs most often in the arms and lower legs.  CAUSES  Cellulitis is caused by bacteria that enter the skin through cracks or cuts in the skin. The most common types of bacteria that cause cellulitis are staphylococci and streptococci. SIGNS AND SYMPTOMS   Redness and warmth.  Swelling.  Tenderness or pain.  Fever. DIAGNOSIS  Your health care provider can usually determine what is wrong based on a physical exam. Blood tests may also be done. TREATMENT  Treatment usually involves taking an antibiotic medicine. HOME CARE INSTRUCTIONS   Take your antibiotic medicine as directed by your health care provider. Finish the antibiotic even if you start to feel better.  Keep the infected arm or leg elevated to reduce swelling.  Apply a warm cloth to the affected area up to 4 times per day to relieve pain.  Take medicines only as directed by your health care provider.  Keep all follow-up visits as directed by your health care provider. SEEK MEDICAL CARE IF:   You notice red streaks coming from the infected area.  Your red area gets larger or turns dark in color.  Your bone or joint underneath the infected area becomes painful after the skin has healed.  Your infection returns in the same area or another area.  You notice a swollen bump in the infected area.  You develop new symptoms.  You have a fever. SEEK IMMEDIATE MEDICAL CARE IF:   You feel very sleepy.  You develop vomiting or diarrhea.  You have a general ill feeling (malaise) with muscle aches and pains.   This information is not intended to replace advice given to you by your health care provider. Make sure you discuss any questions you have with your health care provider.   Document Released: 06/23/2005 Document Revised: 06/04/2015 Document Reviewed: 11/29/2011 Elsevier Interactive  Patient Education 2016 [employer]  Emergency Department Resource Guide 1) Find a Doctor and Pay Out of Pocket Although you won't have to find out who is covered by your insurance plan, it is a good idea to ask around and get recommendations. You will then need to call the office and see if the doctor you have chosen will accept you as a new patient and what types of options they offer for patients who are self-pay. Some doctors offer discounts or will set up payment plans for their patients who do not have insurance, but you will need to ask so you aren't surprised when you get to your appointment.  2) Contact Your Local Health Department Not all health departments have doctors that can see patients for sick visits, but many do, so it is worth a call to see if yours does. If you don't know where your local health department is, you can check in your phone book. The CDC also has a tool to help you locate your state's health department, and many state websites also have listings of all of their local health departments.  3) Find a Walk-in Clinic If your illness is not likely to be very severe or complicated, you may want to try a walk in clinic. These are popping up all over the country in pharmacies, drugstores, and shopping centers. They're usually staffed by nurse practitioners or physician assistants that have been trained to treat common illnesses and complaints. They're usually fairly quick and inexpensive. However, if you have serious  medical issues or chronic medical problems, these are probably not your best option.  No Primary Care Doctor: - Call Health Connect at  [phone] - they can help you locate a primary care doctor that  accepts your insurance, provides certain services, etc. - Physician Referral Service- [phone]  Chronic Pain Problems: Organization         Address  Phone   Notes  [facility]  [phone] Patients need to be referred by their primary  care doctor.   Medication Assistance: Organization         Address  Phone   Notes  [facility] Medication [facility] [address] [place], [place] [address] [phone] --Must be a resident of [place] -- Must have NO insurance coverage whatsoever (no Medicaid/ Medicare, etc.) -- The pt. MUST have a primary care doctor that directs their care regularly and follows them in the community   MedAssist  [phone]   [employer]  [phone]    Agencies that provide inexpensive medical care: Organization         Address  Phone   Notes  [facility]  [phone]   [facility]    [phone]   [facility] [address] [place], [place] [address] [phone]   [facility] [address], [place] [phone]) [phone]   [facility]anned Parenthood    [phone]   [facility]    [phone]   [facility]  [address], [place] Phone:  [phone](336) [phone], Fax:  [phone]) [phone] Hours of Operation:  9 am - 6 pm, M-F.  Also accepts Medicaid/Medicare and self-pay.  [facility]  [address], [place] Phone: [phone](336) [phone], Fax: [phone]) [phone]. Hours of Operation:  8:30 am - 5:30 pm, M-F.  Also accepts Medicaid and self-pay.  [facility] [place] [address], [place] Phone: [phone]) [phone]   [facility] [address], [place], [place] [phone], Ext. [phone] Mondays & Thursdays: 7-9 AM.  First 15 patients are seen on a first come, first serve basis.    Medicaid-accepting [facility] Providers:  Organization         Address  Phone   Notes  [facility] [address], [place] [phone]) [phone] Also accepts self-pay patients.  [facility] [address], [place]  [phone]   [facility] [address], [place] [phone]   [facility] [address] [place] [address], [place] [phone]) [phone]   Renaye RakersVeita Bland [address], [place]   [phone] Only accepts [place] Access [place] patients after they have their name applied to their card.   Self-Pay (no insurance) in [place] [facility]:  Organization         Address  Phone   Notes  Sickle Cell Patients, [facility] [address], [place] [phone]   [facility] [address], [place] [phone]   [facility] [place]  [address], [place] [phone]) [phone]   [facility]/Dr. Osei-Bonsu  [address] [place] [address], [place] or [phone], [place] [phone]) [phone] Phone number for both [place] and [place] locations is  the same.  °[facility] [address], [place] [phone]   °[facility] [place] [address] [place] [address], [place] or [address] [phone] °[phone]   °[facility] [address], [place] [phone], phone; [phone], fax Sees patients 1st and 3rd Saturday of every month.  Must not qualify for public or private insurance (i.e. Medicaid, Medicare, [place] Health Choice, Veterans' Benefits) • Household income should be no more than 200% of the poverty level •The clinic cannot treat you if you are pregnant or think you are pregnant • Sexually transmitted diseases are not treated at the clinic.  ° ° °Dental Care: °Organization         Address  Phone  Notes  °[place] [address], [place] [phone] Accepts children up to age 21 who are enrolled in Medicaid or [place] Health Choice; pregnant women with a Medicaid card; and children who have applied for Medicaid or [place] Health Choice, but were declined, whose parents can pay a reduced fee at  time of service.  °[place] [place]  [address], [place] [phone] Accepts children up to age 21 who are enrolled in Medicaid or [place] Health Choice; pregnant women with a Medicaid card; and children who have applied for Medicaid or [place] Health Choice, but were declined, whose parents can pay a reduced fee at time of service.  °[facility] ° [address], [place] [phone] Patients are seen by appointment only. Walk-ins are not accepted. [facility] will see patients 18 years of age and older. °Monday - Tuesday (8am-5pm) °Most Wednesdays (8:30-5pm) °$30 per visit, cash only  °[facility] ° [address], [place] [phone] Patients are seen by appointment only. Walk-ins are not accepted. [facility] will see patients 18 years of age and older. °One Wednesday Evening (Monthly: Volunteer Based).  $30 per visit, cash only  °[facility]  [phone] for adults; Children under age 4, call Graduate Pediatric Dentistry at [phone]. Children aged 4-14, please call [phone] to request a pediatric application. ° Dental services are provided in all areas of dental care including fillings, crowns and bridges, complete and partial dentures, implants, gum treatment, root canals, and extractions. Preventive care is also provided. Treatment is provided to both adults and children. °Patients are selected via a lottery and there is often a waiting list. °  °[facility] [address], °[place] ° [phone] [email or web address] °  °[facility] [address], [place], [place] [phone], Ext. [phone] Second and Fourth Thursday of each month, opens at 6:30 AM; Clinic ends at 9 AM.  Patients are seen on a first-come first-served basis, and a limited number are seen during each clinic.  ° °[facility] ° [address], [place], [place] [phone]   Eligibility Requirements °You must have lived in [place], [place], or [place] counties for at least the last three months. °  You cannot be eligible for state or federal sponsored healthcare insurance, including Veterans Administration, Medicaid, or Medicare. °  You generally cannot be eligible for healthcare insurance through your employer.  °  How to apply: °Eligibility screenings are held every Tuesday and Wednesday afternoon from 1:00 pm until 4:00 pm. You do not need an appointment   for the interview!  [facility] [address] [place] [address], [place], [place]   [place]  [place] Department  [place]  [phone]    Behavioral Health Resources in the Community: Intensive Outpatient Programs Organization         Address  Phone  Notes  [facility] [place]. [address] [place], [place] [phone]   [facility] [address] [place], [place]   [facility] [address] [place], [place]   [place] [address]  [place], [place] or [phone]   Substance Abuse Resources Organization         Address  Phone  Notes  [facility]  [phone]   [place]  [phone]   [facility] [place]   Chinita Pester  [phone]   Residential & Outpatient Substance Abuse Program  [phone]   Psychological Services Organization         Address  Phone  Notes  [facility] [place]  [place]  [phone]   [place] [address], [place] or [phone]    Mobile Crisis Teams Organization         Address  Phone  Notes  [facility], Mobile Crisis Care Unit  [phone]   Assertive Psychotherapeutic  Services  [address] [place], [place]   [place] [address] [place] [phone]    Self-Help/Support Groups Organization         Address  Phone             Notes  [place] of [place] - variety of support groups  [place] Call for more information  Narcotics Anonymous (NA), [facility] [address], [employer] [place]  2 meetings at this location   Special educational needs teacher         Address  Phone  Notes  [facility] [place],    [place]  [phone]   [facility]  [address] [place] [id number], [place], [place]   [place], [place] [phone] Admissions: 8am-3pm M-F  Incentives Substance [place] [place] [address],    [place], [place] [phone]   [facility] [address] [place], [place], [place]   [facility] [address]  [place], [place]   [facility] - Intensive Outpatient [address], [place], [place]   [facility] [address].,  [place], [place] [phone] or [phone]   [facility] (RTS) [address] [place], [place] Accepts Medicaid  [facility] [address]  [place] [phone] Substance Abuse/Addiction Treatment   [facility] Organization         Address  Phone  Notes  [facility]  [phone]   Domenic Schwab, PhD [address] [place], [place]   [phone] or [phone]   [place], [place] [phone]   [place] [address] [place], [place] [phone] Insurance/Medicaid/sponsorship through [employer] and Families [address] [id number]  [place], [place] [phone] Therapy/tele-psych/case  °[facility] [address]  ° [place], [place] [phone]    °Dr. Arfeen  [phone]   °[facility] [place]  [employer] [place] [facility] 1) [address], [place] °2) [address], [place] °3)  [address], [place] [phone]) [phone] °([phone]) [phone] ° °([phone]) [phone]   °[place] Child Abuse Hotline (336) 342-1394 or (336) [phone] (After Hours)    ° ° ° °

## 2015-07-30 NOTE — ED Provider Notes (Signed)
CSN: [id number]     Arrival date & time 07/30/15  1006 History   First MD Initiated Contact with Patient 07/30/15 1034     Chief Complaint  [id number] presents with  . Facial Swelling     (Consider location/radiation/quality/duration/timing/severity/associated sxs/prior Treatment) HPI Patient ports that she had a little bump on her face this started 3-4 days ago. She states that it has continued to swell and become more painful. She was she do not pick or squeeze the bump and it has stayed fairly small. Above it however, and up towards her eye,the swelling has increased everyday.There has been no visual change. The patient reports aside from the swelling in her face, she feels well. Past Medical History  Diagnosis Date  . Asthma    History reviewed. No pertinent past surgical history. Family History  Problem Relation Age of Onset  . Cancer Mother   . Asthma Mother   . Diabetes Other   . Hypertension Other    Social History  Substance Use Topics  . Smoking status: Never Smoker   . Smokeless tobacco: None  . Alcohol Use: No   OB History    No data available     Review of Systems 10 Systems reviewed and are negative for acute change except as noted in the HPI.    Allergies  Review of patient's allergies indicates no known allergies.  Home Medications   Prior to Admission medications   Medication Sig Start Date End Date Taking? Authorizing Provider  diclofenac (VOLTAREN) 75 MG EC tablet take 1 tablet by mouth twice a day with food if needed 07/16/15  Yes Historical Provider, MD  amoxicillin-clavulanate (AUGMENTIN) 875-125 MG per tablet Take 1 tablet by mouth every 12 (twelve) hours. Patient not taking: Reported on 06/27/2015 02/20/15   Catha GosselinHanna Patel-Mills, PA-C  cephALEXin (KEFLEX) 500 MG capsule Take 2 capsules (1,000 mg total) by mouth 2 (two) times daily. Patient not taking: Reported on 06/27/2015 03/28/15   Fayrene HelperBowie Tran, PA-C  cephALEXin (KEFLEX) 500 MG capsule Take 2 capsules  (1,000 mg total) by mouth 2 (two) times daily. 07/30/15   Arby BarretteMarcy Dannel Rafter, MD  cyclobenzaprine (FLEXERIL) 10 MG tablet Take 1 tablet (10 mg total) by mouth 2 (two) times daily as needed for muscle spasms. Patient not taking: Reported on 07/30/2015 06/27/15   Emilia BeckKaitlyn Szekalski, PA-C  diazepam (VALIUM) 5 MG tablet Take 1 tablet (5 mg total) by mouth 2 (two) times daily. Patient not taking: Reported on 06/27/2015 02/20/15   Catha GosselinHanna Patel-Mills, PA-C  HYDROcodone-acetaminophen (NORCO/VICODIN) 5-325 MG per tablet Take 1-2 tablets by mouth every 4 (four) hours as needed. Patient not taking: Reported on 06/27/2015 02/11/15   Elpidio AnisShari Upstill, PA-C  ibuprofen (ADVIL,MOTRIN) 800 MG tablet Take 1 tablet (800 mg total) by mouth every 8 (eight) hours as needed for moderate pain. Patient not taking: Reported on 07/30/2015 03/28/15   Fayrene HelperBowie Tran, PA-C  ibuprofen (ADVIL,MOTRIN) 800 MG tablet Take 1 tablet (800 mg total) by mouth 3 (three) times daily. 07/30/15   Arby BarretteMarcy Lesleyann Fichter, MD  methocarbamol (ROBAXIN) 500 MG tablet Take 1 tablet (500 mg total) by mouth 2 (two) times daily. Patient not taking: Reported on 07/30/2015 02/20/15   Catha GosselinHanna Patel-Mills, PA-C  naproxen (NAPROSYN) 500 MG tablet Take 1 tablet (500 mg total) by mouth 2 (two) times daily. Patient not taking: Reported on 06/27/2015 02/20/15   Catha GosselinHanna Patel-Mills, PA-C  oxyCODONE-acetaminophen (PERCOCET) 5-325 MG tablet Take 2 tablets by mouth every 4 (four) hours as needed. 07/30/15  Arby Barrette, MD  sulfamethoxazole-trimethoprim (BACTRIM DS,SEPTRA DS) 800-160 MG tablet Take 1 tablet by mouth 2 (two) times daily. 07/30/15 08/06/15  Arby Barrette, MD  traMADol (ULTRAM) 50 MG tablet Take 1 tablet (50 mg total) by mouth every 6 (six) hours as needed. Patient not taking: Reported on 07/30/2015 06/27/15   Kaitlyn Szekalski, PA-C   BP 156/99 mmHg  Pulse 84  Temp(Src) 98.9 F (37.2 C) (Oral)  Resp 18  SpO2 100%  LMP 07/08/2015 Physical Exam  Constitutional: She is oriented to  person, place, and time. She appears well-developed and well-nourished. No distress.  HENT:  Right Ear: External ear normal.  Left Ear: External ear normal.  Nose: Nose normal.  Mouth/Throat: Oropharynx is clear and moist.  Patient has missing upper molars on the left side. There is no tenderness on the gumline here. The teeth that are present are nontender to compression or movement. Posterior oropharynx widely patent. Neck is supple without significant lymphadenopathy.  Eyes: EOM are normal. Pupils are equal, round, and reactive to light.  Neck: Neck supple.  Cardiovascular: Normal rate, regular rhythm, normal heart sounds and intact distal pulses.   Pulmonary/Chest: Effort normal and breath sounds normal.  Neurological: She is alert and oriented to person, place, and time. No cranial nerve deficit. Coordination normal.  Skin: Skin is warm and dry.  Psychiatric: She has a normal mood and affect.          ED Course  Procedures (including critical care time) Ultrasound was used to identify a small focus of fluid approximately less than 5 mm pocket. Sterile prep and drape used. Patient's face anesthetized with 2% lidocaine with epinephrine. 18-gauge needle used with ultrasound guidance to try to aspirate fluid pocket. I was unable to obtain any fluid return. Labs Review Labs Reviewed  WOUND CULTURE    Imaging Review No results found. I have personally reviewed and evaluated these images and lab results as part of my medical decision-making.   EKG Interpretation None      MDM   Final diagnoses:  Facial cellulitis   Patient resents to the facial cellulitis. On physical examination there was an indurated area but not a specific fluctuance. Ultrasound however did show a small pocket which I attempted to aspirate with an 18-gauge needle. However no aspirate was returned from procedure. At this time the patient be placed on dual coverage antibiotics with Bactrim and Keflex. She  has been afebrile with no associated constitutional symptoms. She is to continue warm compresses and have a recheck within 24-48 hours.    Arby Barrette, MD 07/30/15 1326

## 2015-07-30 NOTE — ED Notes (Signed)
MD at bedside. 

## 2015-09-16 ENCOUNTER — Encounter (HOSPITAL_COMMUNITY): Payer: Self-pay | Admitting: *Deleted

## 2015-09-16 ENCOUNTER — Emergency Department (HOSPITAL_COMMUNITY)
Admission: EM | Admit: 2015-09-16 | Discharge: 2015-09-16 | Disposition: A | Discharge status: Home / Self Care | Payer: Medicaid Other | Attending: Emergency Medicine | Admitting: Emergency Medicine

## 2015-09-16 DIAGNOSIS — M5432 Sciatica, left side: Secondary | ICD-10-CM | POA: Diagnosis not present

## 2015-09-16 DIAGNOSIS — Z791 Long term (current) use of non-steroidal anti-inflammatories (NSAID): Secondary | ICD-10-CM | POA: Diagnosis not present

## 2015-09-16 DIAGNOSIS — Z792 Long term (current) use of antibiotics: Secondary | ICD-10-CM | POA: Diagnosis not present

## 2015-09-16 DIAGNOSIS — J45909 Unspecified asthma, uncomplicated: Secondary | ICD-10-CM | POA: Insufficient documentation

## 2015-09-16 DIAGNOSIS — M79604 Pain in right leg: Secondary | ICD-10-CM | POA: Diagnosis present

## 2015-09-16 DIAGNOSIS — M545 Low back pain: Secondary | ICD-10-CM | POA: Diagnosis not present

## 2015-09-16 MED ORDER — HYDROCODONE-ACETAMINOPHEN 5-325 MG PO TABS
1.0000 | ORAL_TABLET | Freq: Once | ORAL | Status: AC
  Administered 2015-09-16: 1 via ORAL
  Filled 2015-09-16: qty 1

## 2015-09-16 MED ORDER — KETOROLAC TROMETHAMINE 30 MG/ML IJ SOLN
60.0000 mg | Freq: Once | INTRAMUSCULAR | Status: AC
  Administered 2015-09-16: 60 mg via INTRAMUSCULAR
  Filled 2015-09-16: qty 2

## 2015-09-16 MED ORDER — OXYCODONE-ACETAMINOPHEN 5-325 MG PO TABS: 1.0000 | ORAL_TABLET | Freq: Four times a day (QID) | ORAL | Status: DC | PRN

## 2015-09-16 MED ORDER — PREDNISONE 50 MG PO TABS: 50.0000 mg | ORAL_TABLET | Freq: Every day | ORAL | Status: DC

## 2015-09-16 MED ORDER — CYCLOBENZAPRINE HCL 10 MG PO TABS: 10.0000 mg | ORAL_TABLET | Freq: Every day | ORAL | Status: DC

## 2015-09-16 NOTE — Discharge Instructions (Signed)
Return here as needed.  Follow up with the Dr. Provided. someone will call you with an appointment time for the MRI

## 2015-09-16 NOTE — ED Notes (Signed)
Pt states that she had back pain a couple of weeks ago and that went away but pt states that she has been having pain radiating from rt buttock and down rt leg; pt states that she has been seen for this and was prescribed Prednisone; pt states that the medication isn't helping; pt states that she saw the Orthopedic and he advised to continue the Prednisone to see if it helped; pt states that when she called for an other appointment she was advised that there were no appointments available right now; pt states that the pain is worse when she is standing; pt states that she has had to leave her job several times due to pain; pt denies numbness or tingling; pt c/o sharp pain radiating down rt leg; pt states that when she is laying down it is only her rt thigh that is painful

## 2015-09-16 NOTE — ED Provider Notes (Signed)
CSN: [id number]     Arrival date & time 09/16/15  2103 History  By signing my name below, I, Soijett Blue, attest that this documentation has been prepared under the direction and in the presence of Ebbie Ridgehris Iszabella Hebenstreit, [employer]-C Electronically Signed: Soijett Blue, ED Scribe. 09/16/2015. 10:10 PM.   Chief Complaint  Patient presents with  . Leg Pain      The history is provided by the patient. No language interpreter was used.    Sarah Hatfield is a 19 y.o. female who presents to the Emergency Department complaining of sharp right leg pain onset 2 weeks. She notes that her symptoms began in her back but her current symptoms are radiating from her butt to her right leg. She reports that she has been seen for her symptoms in [place] and was given a decreasing dosage of prednisone x 6 days that initially alleviated her symptoms and now there is no relief from it. She [place], and was informed to continue use of the prednisone. Since the recurrence of her right leg pain she has tried to get in with the orthopedist again but was informed there were no available appointments. Pt states that she is here today due to increasing pain to her right leg. She states that her right leg pain is worsened with standing and there are no alleviating factors. She notes that she has tried Rx medications with no relief of her symptoms. She denies gait problem, color change, wound, and any other symptoms. She notes that she has a PMHx of back pain.    Past Medical History  Diagnosis Date  . Asthma    No past surgical history on file. Family History  Problem Relation Age of Onset  . Cancer Mother   . Asthma Mother   . Diabetes Other   . Hypertension Other    Social History  Substance Use Topics  . Smoking status: Never Smoker   . Smokeless tobacco: None  . Alcohol Use: Yes     Comment: social   OB History    No data available     Review of Systems  A complete 10 system  review of systems was obtained and all systems are negative except as noted in the HPI and PMH.   Allergies  Review of patient's allergies indicates no known allergies.  Home Medications   Prior to Admission medications   Medication Sig Start Date End Date Taking? Authorizing Provider  amoxicillin-clavulanate (AUGMENTIN) 875-125 MG per tablet Take 1 tablet by mouth every 12 (twelve) hours. Patient not taking: Reported on 06/27/2015 02/20/15   Catha GosselinHanna Patel-Mills, PA-C  cephALEXin (KEFLEX) 500 MG capsule Take 2 capsules (1,000 mg total) by mouth 2 (two) times daily. Patient not taking: Reported on 06/27/2015 03/28/15   Fayrene HelperBowie Tran, PA-C  cephALEXin (KEFLEX) 500 MG capsule Take 2 capsules (1,000 mg total) by mouth 2 (two) times daily. 07/30/15   Arby BarretteMarcy Pfeiffer, MD  cyclobenzaprine (FLEXERIL) 10 MG tablet Take 1 tablet (10 mg total) by mouth 2 (two) times daily as needed for muscle spasms. Patient not taking: Reported on 07/30/2015 06/27/15   Emilia BeckKaitlyn Szekalski, PA-C  diazepam (VALIUM) 5 MG tablet Take 1 tablet (5 mg total) by mouth 2 (two) times daily. Patient not taking: Reported on 06/27/2015 02/20/15   Catha GosselinHanna Patel-Mills, PA-C  diclofenac (VOLTAREN) 75 MG EC tablet take 1 tablet by mouth twice a day with food if needed 07/16/15   Historical Provider, MD  HYDROcodone-acetaminophen (NORCO/VICODIN) 5-325  MG per tablet Take 1-2 tablets by mouth every 4 (four) hours as needed. Patient not taking: Reported on 06/27/2015 02/11/15   Elpidio Anis, PA-C  ibuprofen (ADVIL,MOTRIN) 800 MG tablet Take 1 tablet (800 mg total) by mouth every 8 (eight) hours as needed for moderate pain. Patient not taking: Reported on 07/30/2015 03/28/15   Fayrene Helper, PA-C  ibuprofen (ADVIL,MOTRIN) 800 MG tablet Take 1 tablet (800 mg total) by mouth 3 (three) times daily. 07/30/15   Arby Barrette, MD  methocarbamol (ROBAXIN) 500 MG tablet Take 1 tablet (500 mg total) by mouth 2 (two) times daily. Patient not taking: Reported on 07/30/2015  02/20/15   Catha Gosselin, PA-C  naproxen (NAPROSYN) 500 MG tablet Take 1 tablet (500 mg total) by mouth 2 (two) times daily. Patient not taking: Reported on 06/27/2015 02/20/15   Catha Gosselin, PA-C  oxyCODONE-acetaminophen (PERCOCET) 5-325 MG tablet Take 2 tablets by mouth every 4 (four) hours as needed. 07/30/15   Arby Barrette, MD  traMADol (ULTRAM) 50 MG tablet Take 1 tablet (50 mg total) by mouth every 6 (six) hours as needed. Patient not taking: Reported on 07/30/2015 06/27/15   Kaitlyn Szekalski, PA-C   BP 165/80 mmHg  Pulse 99  Temp(Src) 97.8 F (36.6 C) (Oral)  Resp 18  SpO2 100%  LMP 09/08/2015 Physical Exam  Constitutional: She is oriented to person, place, and time. She appears well-developed and well-nourished. No distress.  HENT:  Head: Normocephalic and atraumatic.  Eyes: EOM are normal.  Neck: Neck supple.  Cardiovascular: Normal rate, regular rhythm and normal heart sounds.  Exam reveals no gallop and no friction rub.   No murmur heard. Pulmonary/Chest: Effort normal and breath sounds normal. No respiratory distress. She has no wheezes. She has no rales.  Musculoskeletal: Normal range of motion.  Lower back and buttocks pain on palpation.   Neurological: She is alert and oriented to person, place, and time.  Skin: Skin is warm and dry.  Psychiatric: She has a normal mood and affect. Her behavior is normal.  Nursing note and vitals reviewed.   ED Course  Procedures (including critical care time) DIAGNOSTIC STUDIES: Oxygen Saturation is 100% on RA, nl by my interpretation.    COORDINATION OF CARE: 10:06 PM Discussed treatment plan with pt at bedside and pt agreed to plan.    I personally performed the services described in this documentation, which was scribed in my presence. The recorded information has been reviewed and is accurate.    Charlestine Night, PA-C 09/19/15 0522  Arby Barrette, MD 09/21/15 [phone]

## 2015-09-25 ENCOUNTER — Other Ambulatory Visit: Payer: Self-pay | Admitting: Sports Medicine

## 2015-09-25 DIAGNOSIS — M5440 Lumbago with sciatica, unspecified side: Secondary | ICD-10-CM

## 2015-10-01 ENCOUNTER — Ambulatory Visit
Admission: RE | Admit: 2015-10-01 | Discharge: 2015-10-01 | Disposition: A | Discharge status: Home / Self Care | Payer: Medicaid Other | Source: Ambulatory Visit | Attending: Sports Medicine | Admitting: Sports Medicine

## 2015-10-01 DIAGNOSIS — M5440 Lumbago with sciatica, unspecified side: Secondary | ICD-10-CM

## 2015-10-10 ENCOUNTER — Other Ambulatory Visit (HOSPITAL_COMMUNITY): Payer: Self-pay | Admitting: Neurological Surgery

## 2015-10-21 ENCOUNTER — Encounter (HOSPITAL_COMMUNITY): Payer: Self-pay

## 2015-10-21 ENCOUNTER — Encounter (HOSPITAL_COMMUNITY)
Admission: RE | Admit: 2015-10-21 | Discharge: 2015-10-21 | Disposition: A | Discharge status: Home / Self Care | Payer: Medicaid Other | Source: Ambulatory Visit | Attending: Neurological Surgery | Admitting: Neurological Surgery

## 2015-10-21 DIAGNOSIS — Z01818 Encounter for other preprocedural examination: Secondary | ICD-10-CM | POA: Diagnosis present

## 2015-10-21 DIAGNOSIS — Z01812 Encounter for preprocedural laboratory examination: Secondary | ICD-10-CM | POA: Insufficient documentation

## 2015-10-21 DIAGNOSIS — M519 Unspecified thoracic, thoracolumbar and lumbosacral intervertebral disc disorder: Secondary | ICD-10-CM | POA: Insufficient documentation

## 2015-10-21 DIAGNOSIS — IMO0002 Reserved for concepts with insufficient information to code with codable children: Secondary | ICD-10-CM

## 2015-10-21 DIAGNOSIS — J45909 Unspecified asthma, uncomplicated: Secondary | ICD-10-CM | POA: Insufficient documentation

## 2015-10-21 DIAGNOSIS — R9431 Abnormal electrocardiogram [ECG] [EKG]: Secondary | ICD-10-CM | POA: Diagnosis not present

## 2015-10-21 HISTORY — DX: Family history of other specified conditions: Z84.89

## 2015-10-21 LAB — PROTIME-INR
INR: 1.04 (ref 0.00–1.49)
Prothrombin Time: 13.8 seconds (ref 11.6–15.2)

## 2015-10-21 LAB — CBC WITH DIFFERENTIAL/PLATELET
Basophils Absolute: 0 10*3/uL (ref 0.0–0.1)
Basophils Relative: 0 %
EOS PCT: 1 %
Eosinophils Absolute: 0.1 10*3/uL (ref 0.0–0.7)
HCT: 40.6 % (ref 36.0–46.0)
Hemoglobin: 13.7 g/dL (ref 12.0–15.0)
LYMPHS ABS: 2.2 10*3/uL (ref 0.7–4.0)
Lymphocytes Relative: 22 %
MCH: 29.1 pg (ref 26.0–34.0)
MCHC: 33.7 g/dL (ref 30.0–36.0)
MCV: 86.4 fL (ref 78.0–100.0)
MONO ABS: 0.5 10*3/uL (ref 0.1–1.0)
Monocytes Relative: 5 %
Neutro Abs: 7.5 10*3/uL (ref 1.7–7.7)
Neutrophils Relative %: 72 %
PLATELETS: 356 10*3/uL (ref 150–400)
RBC: 4.7 MIL/uL (ref 3.87–5.11)
RDW: 13 % (ref 11.5–15.5)
WBC: 10.3 10*3/uL (ref 4.0–10.5)

## 2015-10-21 LAB — HCG, SERUM, QUALITATIVE: Preg, Serum: NEGATIVE

## 2015-10-21 LAB — SURGICAL PCR SCREEN
MRSA, PCR: NEGATIVE
STAPHYLOCOCCUS AUREUS: NEGATIVE

## 2015-10-21 LAB — BASIC METABOLIC PANEL
Anion gap: 9 (ref 5–15)
BUN: 10 mg/dL (ref 6–20)
CALCIUM: 9.7 mg/dL (ref 8.9–10.3)
CO2: 27 mmol/L (ref 22–32)
CREATININE: 0.67 mg/dL (ref 0.44–1.00)
Chloride: 103 mmol/L (ref 101–111)
Glucose, Bld: 80 mg/dL (ref 65–99)
Potassium: 3.9 mmol/L (ref 3.5–5.1)
SODIUM: 139 mmol/L (ref 135–145)

## 2015-10-21 NOTE — Pre-Procedure Instructions (Signed)
Sarah Hatfield  10/21/2015      [facility] - [place], [place] - [address] [place] [address] Phone: [phone] Fax: [phone]    Your procedure is scheduled on 10/23/2015  Report to [facility] Admitting at  [place]  Call this number if you have problems the morning of surgery:  [phone]   Remember:  Do not eat food or drink liquids after midnight.  On Wednesday   Take these medicines the morning of surgery with A SIP OF WATER :  Pain medicine is OK morning of surgery if needed.               STOP Naprosyn, Ibuprofen, Advile, Aleve     Do not wear jewelry, make-up or nail polish.   Do not wear lotions, powders, or perfumes.  You may wear deodorant.   Do not shave 48 hours prior to surgery.     Do not bring valuables to the hospital.   [facility] is not responsible for any belongings or valuables.  Contacts, dentures or bridgework may not be worn into surgery.  Leave your suitcase in the car.  After surgery it may be brought to your room.  For patients admitted to the hospital, discharge time will be determined by your treatment team.  Patients discharged the day of surgery will not be allowed to drive home.   Name and phone number of your driver:   With family   Special instructions:  Special Instructions: [facility] - Preparing for Surgery  Before surgery, you can play an important role.  Because skin is not sterile, your skin needs to be as free of germs as possible.  You can reduce the number of germs on you skin by washing with CHG (chlorahexidine gluconate) soap before surgery.  CHG is an antiseptic cleaner which kills germs and bonds with the skin to continue killing germs even after washing.  Please DO NOT use if you have an allergy to CHG or antibacterial soaps.  If your skin becomes reddened/irritated stop using the CHG and inform your nurse when you arrive at [facility].  Do not shave (including legs and underarms) for at least 48 hours prior to the first CHG shower.  You may shave your face.  Please follow these instructions carefully:   1.  Shower with CHG Soap the night before surgery and the  morning of Surgery.  2.  If you choose to wash your hair, wash your hair first as usual with your  normal shampoo.  3.  After you shampoo, rinse your hair and body thoroughly to remove the  Shampoo.  4.  Use CHG as you would any other liquid soap.  You can apply chg directly to the skin and wash gently with scrungie or a clean washcloth.  5.  Apply the CHG Soap to your body ONLY FROM THE NECK DOWN.    Do not use on open wounds or open sores.  Avoid contact with your eyes, ears, mouth and genitals (private parts).  Wash genitals (private parts)   with your normal soap.  6.  Wash thoroughly, paying special attention to the area where your surgery will be performed.  7.  Thoroughly rinse your body with warm water from the neck down.  8.  DO NOT shower/wash with your normal soap after using and rinsing off   the CHG Soap.  9.  Pat yourself dry  with a clean towel.            10.  Wear clean pajamas.            11.  Place clean sheets on your bed the night of your first shower and do not sleep with pets.  Day of Surgery  Do not apply any lotions/deodorants the morning of surgery.  Please wear clean clothes to the hospital/surgery center.     Please read over the following fact sheets that you were given. Pain Booklet, Coughing and Deep Breathing, MRSA Information and Surgical Site Infection Prevention

## 2015-10-22 MED ORDER — DEXAMETHASONE SODIUM PHOSPHATE 10 MG/ML IJ SOLN
10.0000 mg | INTRAMUSCULAR | Status: DC
  Filled 2015-10-22: qty 1

## 2015-10-22 MED ORDER — DEXTROSE 5 % IV SOLN
3.0000 g | INTRAVENOUS | Status: AC
  Administered 2015-10-23: 3 g via INTRAVENOUS
  Filled 2015-10-22: qty 3000

## 2015-10-23 ENCOUNTER — Ambulatory Visit (HOSPITAL_COMMUNITY)
Admission: RE | Admit: 2015-10-23 | Discharge: 2015-10-24 | Disposition: A | Discharge status: Home / Self Care | Payer: Medicaid Other | Source: Ambulatory Visit | Attending: Neurological Surgery | Admitting: Neurological Surgery

## 2015-10-23 ENCOUNTER — Ambulatory Visit (HOSPITAL_COMMUNITY): Payer: Medicaid Other | Admitting: Anesthesiology

## 2015-10-23 ENCOUNTER — Ambulatory Visit (HOSPITAL_COMMUNITY): Payer: Medicaid Other

## 2015-10-23 ENCOUNTER — Ambulatory Visit (HOSPITAL_COMMUNITY): Payer: Medicaid Other | Admitting: Emergency Medicine

## 2015-10-23 ENCOUNTER — Encounter (HOSPITAL_COMMUNITY): Payer: Self-pay | Admitting: Anesthesiology

## 2015-10-23 ENCOUNTER — Encounter (HOSPITAL_COMMUNITY)
Admission: RE | Disposition: A | Discharge status: Home / Self Care | Payer: Self-pay | Source: Ambulatory Visit | Attending: Neurological Surgery

## 2015-10-23 DIAGNOSIS — J45909 Unspecified asthma, uncomplicated: Secondary | ICD-10-CM | POA: Insufficient documentation

## 2015-10-23 DIAGNOSIS — M5126 Other intervertebral disc displacement, lumbar region: Secondary | ICD-10-CM | POA: Diagnosis present

## 2015-10-23 DIAGNOSIS — M7138 Other bursal cyst, other site: Secondary | ICD-10-CM | POA: Diagnosis not present

## 2015-10-23 DIAGNOSIS — M21372 Foot drop, left foot: Secondary | ICD-10-CM | POA: Insufficient documentation

## 2015-10-23 DIAGNOSIS — M5127 Other intervertebral disc displacement, lumbosacral region: Secondary | ICD-10-CM | POA: Insufficient documentation

## 2015-10-23 DIAGNOSIS — M4806 Spinal stenosis, lumbar region: Secondary | ICD-10-CM | POA: Insufficient documentation

## 2015-10-23 DIAGNOSIS — Z9889 Other specified postprocedural states: Secondary | ICD-10-CM

## 2015-10-23 DIAGNOSIS — Z419 Encounter for procedure for purposes other than remedying health state, unspecified: Secondary | ICD-10-CM

## 2015-10-23 HISTORY — PX: LUMBAR LAMINECTOMY/DECOMPRESSION MICRODISCECTOMY: SHX5026

## 2015-10-23 SURGERY — LUMBAR LAMINECTOMY/DECOMPRESSION MICRODISCECTOMY 1 LEVEL
Anesthesia: General | Site: Back | Laterality: Left

## 2015-10-23 MED ORDER — BUPIVACAINE HCL (PF) 0.25 % IJ SOLN
INTRAMUSCULAR | Status: DC | PRN
Start: 2015-10-23 — End: 2015-10-23
  Administered 2015-10-23: 4 mL

## 2015-10-23 MED ORDER — ONDANSETRON HCL 4 MG/2ML IJ SOLN
INTRAMUSCULAR | Status: AC
  Filled 2015-10-23: qty 2

## 2015-10-23 MED ORDER — HEMOSTATIC AGENTS (NO CHARGE) OPTIME
TOPICAL | Status: DC | PRN
  Administered 2015-10-23: 1 via TOPICAL

## 2015-10-23 MED ORDER — PROPOFOL 10 MG/ML IV BOLUS
INTRAVENOUS | Status: AC
  Filled 2015-10-23: qty 20

## 2015-10-23 MED ORDER — MORPHINE SULFATE (PF) 2 MG/ML IV SOLN
1.0000 mg | INTRAVENOUS | Status: DC | PRN
  Filled 2015-10-23: qty 2

## 2015-10-23 MED ORDER — ONDANSETRON HCL 4 MG/2ML IJ SOLN
INTRAMUSCULAR | Status: DC | PRN
  Administered 2015-10-23: 4 mg via INTRAVENOUS

## 2015-10-23 MED ORDER — FENTANYL CITRATE (PF) 250 MCG/5ML IJ SOLN
INTRAMUSCULAR | Status: AC
  Filled 2015-10-23: qty 5

## 2015-10-23 MED ORDER — ROCURONIUM BROMIDE 50 MG/5ML IV SOLN
INTRAVENOUS | Status: AC
  Filled 2015-10-23: qty 1

## 2015-10-23 MED ORDER — MENTHOL 3 MG MT LOZG
1.0000 | LOZENGE | OROMUCOSAL | Status: DC | PRN
Start: 2015-10-23 — End: 2015-10-24

## 2015-10-23 MED ORDER — HYDROMORPHONE HCL 1 MG/ML IJ SOLN
0.2500 mg | INTRAMUSCULAR | Status: DC | PRN
  Administered 2015-10-23 (×4): 0.5 mg via INTRAVENOUS

## 2015-10-23 MED ORDER — ACETAMINOPHEN 650 MG RE SUPP: 650.0000 mg | RECTAL | Status: DC | PRN

## 2015-10-23 MED ORDER — HYDROMORPHONE HCL 1 MG/ML IJ SOLN
INTRAMUSCULAR | Status: AC
  Administered 2015-10-23: 0.5 mg via INTRAVENOUS
  Filled 2015-10-23: qty 1

## 2015-10-23 MED ORDER — ROCURONIUM BROMIDE 100 MG/10ML IV SOLN
INTRAVENOUS | Status: DC | PRN
  Administered 2015-10-23: 50 mg via INTRAVENOUS

## 2015-10-23 MED ORDER — ACETAMINOPHEN 10 MG/ML IV SOLN
INTRAVENOUS | Status: AC
  Filled 2015-10-23: qty 100

## 2015-10-23 MED ORDER — SUGAMMADEX SODIUM 200 MG/2ML IV SOLN
INTRAVENOUS | Status: AC
  Filled 2015-10-23: qty 2

## 2015-10-23 MED ORDER — PHENOL 1.4 % MT LIQD
1.0000 | OROMUCOSAL | Status: DC | PRN
Start: 2015-10-23 — End: 2015-10-24

## 2015-10-23 MED ORDER — ONDANSETRON HCL 4 MG/2ML IJ SOLN
4.0000 mg | INTRAMUSCULAR | Status: DC | PRN
  Administered 2015-10-23: 4 mg via INTRAVENOUS
  Filled 2015-10-23: qty 2

## 2015-10-23 MED ORDER — DEXAMETHASONE SODIUM PHOSPHATE 4 MG/ML IJ SOLN
INTRAMUSCULAR | Status: DC | PRN
  Administered 2015-10-23: 4 mg via INTRAVENOUS

## 2015-10-23 MED ORDER — OXYCODONE-ACETAMINOPHEN 5-325 MG PO TABS
ORAL_TABLET | ORAL | Status: AC
  Filled 2015-10-23: qty 2

## 2015-10-23 MED ORDER — BACITRACIN 50000 UNITS IM SOLR
INTRAMUSCULAR | Status: DC | PRN
  Administered 2015-10-23: 13:00:00

## 2015-10-23 MED ORDER — FENTANYL CITRATE (PF) 100 MCG/2ML IJ SOLN
INTRAMUSCULAR | Status: DC | PRN
  Administered 2015-10-23: 200 ug via INTRAVENOUS
  Administered 2015-10-23 (×4): 50 ug via INTRAVENOUS

## 2015-10-23 MED ORDER — PROPOFOL 10 MG/ML IV BOLUS
INTRAVENOUS | Status: DC | PRN
  Administered 2015-10-23: 30 mg via INTRAVENOUS
  Administered 2015-10-23: 200 mg via INTRAVENOUS

## 2015-10-23 MED ORDER — MIDAZOLAM HCL 2 MG/2ML IJ SOLN
INTRAMUSCULAR | Status: AC
  Filled 2015-10-23: qty 2

## 2015-10-23 MED ORDER — METHOCARBAMOL 1000 MG/10ML IJ SOLN: 500.0000 mg | Freq: Four times a day (QID) | INTRAVENOUS | Status: DC | PRN

## 2015-10-23 MED ORDER — SUGAMMADEX SODIUM 200 MG/2ML IV SOLN
INTRAVENOUS | Status: DC | PRN
  Administered 2015-10-23: 200 mg via INTRAVENOUS

## 2015-10-23 MED ORDER — LACTATED RINGERS IV SOLN
INTRAVENOUS | Status: DC
  Administered 2015-10-23: 14:00:00 via INTRAVENOUS
  Administered 2015-10-23: 50 mL/h via INTRAVENOUS

## 2015-10-23 MED ORDER — SODIUM CHLORIDE 0.9% FLUSH: 3.0000 mL | INTRAVENOUS | Status: DC | PRN

## 2015-10-23 MED ORDER — 0.9 % SODIUM CHLORIDE (POUR BTL) OPTIME
TOPICAL | Status: DC | PRN
  Administered 2015-10-23: 1000 mL

## 2015-10-23 MED ORDER — SODIUM CHLORIDE 0.9% FLUSH
3.0000 mL | Freq: Two times a day (BID) | INTRAVENOUS | Status: DC
  Administered 2015-10-23: 3 mL via INTRAVENOUS

## 2015-10-23 MED ORDER — ACETAMINOPHEN 325 MG PO TABS: 650.0000 mg | ORAL_TABLET | ORAL | Status: DC | PRN

## 2015-10-23 MED ORDER — OXYCODONE-ACETAMINOPHEN 5-325 MG PO TABS
1.0000 | ORAL_TABLET | ORAL | Status: DC | PRN
  Administered 2015-10-23 – 2015-10-24 (×4): 2 via ORAL
  Filled 2015-10-23 (×3): qty 2

## 2015-10-23 MED ORDER — THROMBIN 5000 UNITS EX SOLR
CUTANEOUS | Status: DC | PRN
  Administered 2015-10-23 (×3): 5000 [IU] via TOPICAL

## 2015-10-23 MED ORDER — ACETAMINOPHEN 10 MG/ML IV SOLN
1000.0000 mg | Freq: Once | INTRAVENOUS | Status: AC
  Administered 2015-10-23: 1000 mg via INTRAVENOUS

## 2015-10-23 MED ORDER — LIDOCAINE HCL (CARDIAC) 20 MG/ML IV SOLN
INTRAVENOUS | Status: DC | PRN
  Administered 2015-10-23: 100 mg via INTRAVENOUS

## 2015-10-23 MED ORDER — MIDAZOLAM HCL 5 MG/5ML IJ SOLN
INTRAMUSCULAR | Status: DC | PRN
  Administered 2015-10-23: 2 mg via INTRAVENOUS

## 2015-10-23 MED ORDER — DEXTROSE 5 % IV SOLN
3.0000 g | Freq: Three times a day (TID) | INTRAVENOUS | Status: AC
  Administered 2015-10-23 – 2015-10-24 (×2): 3 g via INTRAVENOUS
  Filled 2015-10-23 (×3): qty 3000

## 2015-10-23 MED ORDER — DEXAMETHASONE SODIUM PHOSPHATE 4 MG/ML IJ SOLN
INTRAMUSCULAR | Status: AC
  Filled 2015-10-23: qty 1

## 2015-10-23 MED ORDER — SODIUM CHLORIDE 0.9 % IV SOLN: 250.0000 mL | INTRAVENOUS | Status: DC

## 2015-10-23 MED ORDER — METHOCARBAMOL 500 MG PO TABS
500.0000 mg | ORAL_TABLET | Freq: Four times a day (QID) | ORAL | Status: DC | PRN
  Administered 2015-10-23 – 2015-10-24 (×2): 500 mg via ORAL
  Filled 2015-10-23 (×2): qty 1

## 2015-10-23 MED ORDER — LIDOCAINE HCL (CARDIAC) 20 MG/ML IV SOLN
INTRAVENOUS | Status: AC
  Filled 2015-10-23: qty 5

## 2015-10-23 MED ORDER — GELATIN ABSORBABLE MT POWD
OROMUCOSAL | Status: DC | PRN
  Administered 2015-10-23: 13:00:00 via TOPICAL

## 2015-10-23 MED ORDER — POTASSIUM CHLORIDE IN NACL 20-0.9 MEQ/L-% IV SOLN
INTRAVENOUS | Status: DC
  Filled 2015-10-23 (×3): qty 1000

## 2015-10-23 SURGICAL SUPPLY — 40 items
BAG DECANTER FOR FLEXI CONT (MISCELLANEOUS) ×3 IMPLANT
BENZOIN TINCTURE PRP APPL 2/3 (GAUZE/BANDAGES/DRESSINGS) ×3 IMPLANT
BUR MATCHSTICK NEURO 3.0 LAGG (BURR) ×3 IMPLANT
CANISTER SUCT 3000ML PPV (MISCELLANEOUS) ×3 IMPLANT
CLOSURE WOUND 1/2 X4 (GAUZE/BANDAGES/DRESSINGS) ×1
DERMABOND ADVANCED (GAUZE/BANDAGES/DRESSINGS) ×2
DERMABOND ADVANCED .7 DNX12 (GAUZE/BANDAGES/DRESSINGS) ×1 IMPLANT
DRAPE LAPAROTOMY 100X72X124 (DRAPES) ×3 IMPLANT
DRAPE MICROSCOPE LEICA (MISCELLANEOUS) ×3 IMPLANT
DRAPE POUCH INSTRU U-SHP 10X18 (DRAPES) ×3 IMPLANT
DRAPE SURG 17X23 STRL (DRAPES) ×3 IMPLANT
DRSG OPSITE POSTOP 4X6 (GAUZE/BANDAGES/DRESSINGS) ×3 IMPLANT
DURAPREP 26ML APPLICATOR (WOUND CARE) ×3 IMPLANT
ELECT REM PT RETURN 9FT ADLT (ELECTROSURGICAL) ×3
ELECTRODE REM PT RTRN 9FT ADLT (ELECTROSURGICAL) ×1 IMPLANT
GAUZE SPONGE 4X4 16PLY XRAY LF (GAUZE/BANDAGES/DRESSINGS) IMPLANT
GLOVE BIO SURGEON STRL SZ8 (GLOVE) ×3 IMPLANT
GOWN STRL REUS W/ TWL LRG LVL3 (GOWN DISPOSABLE) IMPLANT
GOWN STRL REUS W/ TWL XL LVL3 (GOWN DISPOSABLE) ×2 IMPLANT
GOWN STRL REUS W/TWL 2XL LVL3 (GOWN DISPOSABLE) IMPLANT
GOWN STRL REUS W/TWL LRG LVL3 (GOWN DISPOSABLE)
GOWN STRL REUS W/TWL XL LVL3 (GOWN DISPOSABLE) ×4
HEMOSTAT POWDER KIT SURGIFOAM (HEMOSTASIS) ×3 IMPLANT
KIT BASIN OR (CUSTOM PROCEDURE TRAY) ×3 IMPLANT
KIT ROOM TURNOVER OR (KITS) ×3 IMPLANT
NEEDLE HYPO 25X1 1.5 SAFETY (NEEDLE) ×3 IMPLANT
NEEDLE SPNL 20GX3.5 QUINCKE YW (NEEDLE) ×3 IMPLANT
NS IRRIG 1000ML POUR BTL (IV SOLUTION) ×3 IMPLANT
PACK LAMINECTOMY NEURO (CUSTOM PROCEDURE TRAY) ×3 IMPLANT
PAD ARMBOARD 7.5X6 YLW CONV (MISCELLANEOUS) ×9 IMPLANT
RUBBERBAND STERILE (MISCELLANEOUS) ×6 IMPLANT
SPONGE SURGIFOAM ABS GEL SZ50 (HEMOSTASIS) ×3 IMPLANT
STRIP CLOSURE SKIN 1/2X4 (GAUZE/BANDAGES/DRESSINGS) ×2 IMPLANT
SUT VIC AB 0 CT1 18XCR BRD8 (SUTURE) ×1 IMPLANT
SUT VIC AB 0 CT1 8-18 (SUTURE) ×2
SUT VIC AB 2-0 CP2 18 (SUTURE) ×3 IMPLANT
SUT VIC AB 3-0 SH 8-18 (SUTURE) ×3 IMPLANT
TOWEL OR 17X24 6PK STRL BLUE (TOWEL DISPOSABLE) ×3 IMPLANT
TOWEL OR 17X26 10 PK STRL BLUE (TOWEL DISPOSABLE) ×3 IMPLANT
WATER STERILE IRR 1000ML POUR (IV SOLUTION) ×3 IMPLANT

## 2015-10-23 NOTE — Anesthesia Preprocedure Evaluation (Signed)
Anesthesia Evaluation  Patient identified by MRN, date of birth, ID band Patient awake    Reviewed: Allergy & Precautions, NPO status   Airway Mallampati: II  TM Distance: >3 FB Neck ROM: Full    Dental   Pulmonary asthma ,    breath sounds clear to auscultation       Cardiovascular  Rhythm:Regular Rate:Normal     Neuro/Psych    GI/Hepatic negative GI ROS, Neg liver ROS,   Endo/Other  negative endocrine ROS  Renal/GU negative Renal ROS     Musculoskeletal   Abdominal   Peds  Hematology   Anesthesia Other Findings   Reproductive/Obstetrics                             Anesthesia Physical Anesthesia Plan  ASA: III  Anesthesia Plan: General   Post-op Pain Management:    Induction: Intravenous  Airway Management Planned: Oral ETT  Additional Equipment:   Intra-op Plan:   Post-operative Plan: Extubation in OR  Informed Consent: I have reviewed the patients History and Physical, chart, labs and discussed the procedure including the risks, benefits and alternatives for the proposed anesthesia with the patient or authorized representative who has indicated his/her understanding and acceptance.   Dental advisory given  Plan Discussed with: CRNA and Anesthesiologist  Anesthesia Plan Comments:         Anesthesia Quick Evaluation

## 2015-10-23 NOTE — Anesthesia Postprocedure Evaluation (Signed)
Anesthesia Post Note  Patient: Sarah Hatfield  Procedure(s) Performed: Procedure(s) (LRB): LUMBAR LAMINECTOMY/DECOMPRESSION MICRODISCECTOMY LUMBAR FOUR-FIVE (Left)  Patient location during evaluation: PACU Anesthesia Type: General Level of consciousness: awake Pain management: pain level controlled Vital Signs Assessment: post-procedure vital signs reviewed and stable Respiratory status: spontaneous breathing Cardiovascular status: stable Anesthetic complications: no    Last Vitals:  Filed Vitals:   10/23/15 1617 10/23/15 1642  BP: 113/66 150/72  Pulse: 85 95  Temp: 36.2 C   Resp: 13 16    Last Pain:  Filed Vitals:   10/23/15 1642  PainSc: 2                  EDWARDS,Kelleigh Skerritt

## 2015-10-23 NOTE — H&P (Signed)
Subjective: Patient is a 20 y.o. female admitted for lumbar disc herniation causing leg pain and foot drop. Onset of symptoms was several months ago, gradually worsening since that time.  The pain is rated severe, and is located at the across the lower back and radiates to leg. The pain is described as aching and occurs all day. The symptoms have been progressive. Symptoms are exacerbated by exercise. MRI or CT showed herniated disc L4-5   Past Medical History  Diagnosis Date  . Asthma     rare use of inhaler, last time couple of yrs. ago  . Family history of adverse reaction to anesthesia     sister- wakes up during surgery    Past Surgical History  Procedure Laterality Date  . Upper gi endoscopy      done as a child, told wnl    Prior to Admission medications   Medication Sig Start Date End Date Taking? Authorizing Provider  HYDROcodone-acetaminophen (NORCO/VICODIN) 5-325 MG per tablet Take 1-2 tablets by mouth every 4 (four) hours as needed. 02/11/15  Yes Shari Upstill, PA-C  methocarbamol (ROBAXIN) 500 MG tablet Take 1 tablet (500 mg total) by mouth 2 (two) times daily. 02/20/15   Catha Gosselin, PA-C   No Known Allergies  Social History  Substance Use Topics  . Smoking status: Never Smoker   . Smokeless tobacco: Not on file  . Alcohol Use: Yes     Comment: social, occasionally     Family History  Problem Relation Age of Onset  . Cancer Mother   . Asthma Mother   . Diabetes Other   . Hypertension Other      Review of Systems  Positive ROS: neg  All other systems have been reviewed and were otherwise negative with the exception of those mentioned in the HPI and as above.  Objective: Vital signs in last 24 hours: Temp:  [98.1 F (36.7 C)] 98.1 F (36.7 C) (01/26 1152) Pulse Rate:  [84] 84 (01/26 1152) Resp:  [18] 18 (01/26 1152) BP: (112)/(68) 112/68 mmHg (01/26 1152) SpO2:  [100 %] 100 % (01/26 1152) Weight:  [160.936 kg (354 lb 12.8 oz)] 160.936 kg (354 lb  12.8 oz) (01/26 1152)  General Appearance: Alert, cooperative, no distress, appears stated age Head: Normocephalic, without obvious abnormality, atraumatic Eyes: PERRL, conjunctiva/corneas clear, EOM's intact    Neck: Supple, symmetrical, trachea midline Back: Symmetric, no curvature, ROM normal, no CVA tenderness Lungs:  respirations unlabored Heart: Regular rate and rhythm Abdomen: Soft, non-tender Extremities: Extremities normal, atraumatic, no cyanosis or edema Pulses: 2+ and symmetric all extremities Skin: Skin color, texture, turgor normal, no rashes or lesions  NEUROLOGIC:   Mental status: Alert and oriented x4,  no aphasia, good attention span, fund of knowledge, and memory Motor Exam - grossly normal suffer chronic foot drop on the left Sensory Exam - grossly normal Reflexes: 1+ Coordination - grossly normal Gait - not tested Balance - grossly normal Cranial Nerves: I: smell Not tested  II: visual acuity  OS: nl    OD: nl  II: visual fields Full to confrontation  II: pupils Equal, round, reactive to light  III,VII: ptosis None  III,IV,VI: extraocular muscles  Full ROM  V: mastication Normal  V: facial light touch sensation  Normal  V,VII: corneal reflex  Present  VII: facial muscle function - upper  Normal  VII: facial muscle function - lower Normal  VIII: hearing Not tested  IX: soft palate elevation  Normal  IX,X: gag  reflex Present  XI: trapezius strength  5/5  XI: sternocleidomastoid strength 5/5  XI: neck flexion strength  5/5  XII: tongue strength  Normal    Data Review Lab Results  Component Value Date   WBC 10.3 10/21/2015   HGB 13.7 10/21/2015   HCT 40.6 10/21/2015   MCV 86.4 10/21/2015   PLT 356 10/21/2015   Lab Results  Component Value Date   NA 139 10/21/2015   K 3.9 10/21/2015   CL 103 10/21/2015   CO2 27 10/21/2015   BUN 10 10/21/2015   CREATININE 0.67 10/21/2015   GLUCOSE 80 10/21/2015   Lab Results  Component Value Date   INR  1.04 10/21/2015    Assessment/Plan: Patient admitted for left L4-5 microdiscectomy. Patient has failed a reasonable attempt at conservative therapy.  I explained the condition and procedure to the patient and answered any questions.  Patient wishes to proceed with procedure as planned. Understands risks/ benefits and typical outcomes of procedure.   Dorrien Grunder S 10/23/2015 12:21 PM

## 2015-10-23 NOTE — Op Note (Signed)
10/23/2015  2:53 PM  PATIENT:  Sarah Hatfield  20 y.o. 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 MRI showed a very large disc herniation causing severe spinal stenosis at L4-5. I recommended decompression and discectomy. Patient understood the risks, benefits, and alternatives and potential outcomes and wished to proceed.  PROCEDURE DETAILS: The patient was taken to the operating room and after induction of adequate generalized endotracheal anesthesia, the patient was rolled into the prone position on the Wilson frame and all pressure points were padded. The lumbar region was cleaned and then prepped with DuraPrep and draped in the usual sterile fashion. 5 cc of local anesthesia was injected and then a dorsal midline incision was made and carried down to the lumbo sacral fascia. The fascia was opened and the paraspinous musculature was taken down in a subperiosteal fashion to expose L4-5 on the left. Intraoperative x-ray confirmed my level, and then I used a combination of the high-speed drill and the Kerrison punches to perform a hemilaminectomy, medial facetectomy, and foraminotomy at L4-5 on the left. The underlying yellow ligament was opened and removed in a piecemeal fashion to expose the underlying dura and exiting nerve root. I undercut the lateral recess and dissected down until I was medial to and distal to the pedicle. The nerve root  was well decompressed. There was an obvious large disc herniation posterior lateral to the dura and nerve root on the left. I started the discectomy with a nerve hook and removed several large fragments. We then gently retracted the nerve root medially with a retractor, coagulated the epidural venous vasculature, and inspected the disc space. There is an annular tear in the center of the disc space.. I then palpated with a coronary dilator along the nerve root and into the foramen to assure adequate decompression. I felt no more compression of the nerve root. I irrigated with saline solution containing bacitracin. Achieved hemostasis with bipolar cautery, lined the dura with Gelfoam, and then closed the fascia with 0 Vicryl. I closed the subcutaneous tissues with 2-0 Vicryl and the subcuticular tissues with 3-0 Vicryl. The skin was then closed with benzoin and Steri-Strips. The drapes were removed, a sterile dressing was applied. The patient was awakened from general anesthesia and transferred to the recovery room in stable condition. At the end of the procedure all sponge, needle and instrument counts were correct.   PLAN OF CARE: Admit for overnight observation  PATIENT DISPOSITION:  PACU - hemodynamically stable.   Delay start of Pharmacological VTE agent (>24hrs) due to surgical blood loss or risk of bleeding:  yes

## 2015-10-23 NOTE — Transfer of Care (Signed)
Immediate Anesthesia Transfer of Care Note  Patient: Sarah Hatfield  Procedure(s) Performed: Procedure(s): LUMBAR LAMINECTOMY/DECOMPRESSION MICRODISCECTOMY LUMBAR FOUR-FIVE (Left)  Patient Location: PACU  Anesthesia Type:General  Level of Consciousness: awake, sedated and patient cooperative  Airway & Oxygen Therapy: Patient Spontanous Breathing and Patient connected to nasal cannula oxygen  Post-op Assessment: Report given to RN and Post -op Vital signs reviewed and stable  Post vital signs: Reviewed  Last Vitals:  Filed Vitals:   10/23/15 1502 10/23/15 1503  BP: 136/82   Pulse: 102   Temp:  36.3 C  Resp: 9     Complications: No apparent anesthesia complications

## 2015-10-23 NOTE — Anesthesia Procedure Notes (Signed)
Procedure Name: Intubation Date/Time: 10/23/2015 1:05 PM Performed by: Lovie Chol Pre-anesthesia Checklist: Patient identified, Emergency Drugs available, Suction available, Patient being monitored and Timeout performed Patient Re-evaluated:Patient Re-evaluated prior to inductionOxygen Delivery Method: Circle system utilized Preoxygenation: Pre-oxygenation with 100% oxygen Intubation Type: IV induction Ventilation: Mask ventilation without difficulty Laryngoscope Size: Miller and 3 Grade View: Grade I Tube type: Oral Tube size: 7.5 mm Number of attempts: 1 Airway Equipment and Method: Stylet Placement Confirmation: ETT inserted through vocal cords under direct vision,  positive ETCO2,  CO2 detector and breath sounds checked- equal and bilateral Secured at: 22 cm Tube secured with: Tape Dental Injury: Teeth and Oropharynx as per pre-operative assessment

## 2015-10-24 ENCOUNTER — Encounter (HOSPITAL_COMMUNITY): Payer: Self-pay | Admitting: Neurological Surgery

## 2015-10-24 DIAGNOSIS — M5126 Other intervertebral disc displacement, lumbar region: Secondary | ICD-10-CM | POA: Diagnosis not present

## 2015-10-24 MED ORDER — HYDROCODONE-ACETAMINOPHEN 5-325 MG PO TABS: 1.0000 | ORAL_TABLET | ORAL | Status: DC | PRN

## 2015-10-24 MED ORDER — METHOCARBAMOL 500 MG PO TABS: 500.0000 mg | ORAL_TABLET | Freq: Four times a day (QID) | ORAL | Status: DC | PRN

## 2015-10-24 NOTE — Progress Notes (Signed)
Pt and mother given D/C instructions with Rx's, verbal understanding was provided. Pt's incision is clean and dry with no sign of infection. Pt's IV was removed prior to D/C. Pt D/C'd home via wheelchair @ 1250 per MD order. Pt is stable @ D/C and has no other needs at this time. Rema Fendt, RN

## 2015-10-24 NOTE — Discharge Summary (Signed)
Physician Discharge Summary  Patient ID: Sarah Hatfield MRN: [id number] DOB/AGE: [birth year] 20 y.o.  Admit date: 10/23/2015 Discharge date: 10/24/2015  Admission Diagnoses: HNP L4-5   Discharge Diagnoses: same   Discharged Condition: good  Hospital Course: The patient was admitted on 10/23/2015 and taken to the operating room where the patient underwent L L4-5 microdiskectomy. The patient tolerated the procedure well and was taken to the recovery room and then to the floor in stable condition. The hospital course was routine. There were no complications. The wound remained clean dry and intact. Pt had appropriate back soreness. No complaints of leg pain or new N/T/W. The patient remained afebrile with stable vital signs, and tolerated a regular diet. The patient continued to increase activities, and pain was well controlled with oral pain medications.   Consults: None  Significant Diagnostic Studies:  Results for orders placed or performed during the hospital encounter of 10/21/15  Surgical pcr screen  Result Value Ref Range   MRSA, PCR NEGATIVE NEGATIVE   Staphylococcus aureus NEGATIVE NEGATIVE  hCG, serum, qualitative  Result Value Ref Range   Preg, Serum NEGATIVE NEGATIVE  Basic metabolic panel  Result Value Ref Range   Sodium 139 135 - 145 mmol/L   Potassium 3.9 3.5 - 5.1 mmol/L   Chloride 103 101 - 111 mmol/L   CO2 27 22 - 32 mmol/L   Glucose, Bld 80 65 - 99 mg/dL   BUN 10 6 - 20 mg/dL   Creatinine, Ser 4.09 0.44 - 1.00 mg/dL   Calcium 9.7 8.9 - 81.1 mg/dL   GFR calc non Af Amer >60 >60 mL/min   GFR calc Af Amer >60 >60 mL/min   Anion gap 9 5 - 15  CBC WITH DIFFERENTIAL  Result Value Ref Range   WBC 10.3 4.0 - 10.5 K/uL   RBC 4.70 3.87 - 5.11 MIL/uL   Hemoglobin 13.7 12.0 - 15.0 g/dL   HCT 91.4 78.2 - 95.6 %   MCV 86.4 78.0 - 100.0 fL   MCH 29.1 26.0 - 34.0 pg   MCHC 33.7 30.0 - 36.0 g/dL   RDW 21.3 08.6 - 57.8 %   Platelets 356 150 - 400 K/uL   Neutrophils  Relative % 72 %   Neutro Abs 7.5 1.7 - 7.7 K/uL   Lymphocytes Relative 22 %   Lymphs Abs 2.2 0.7 - 4.0 K/uL   Monocytes Relative 5 %   Monocytes Absolute 0.5 0.1 - 1.0 K/uL   Eosinophils Relative 1 %   Eosinophils Absolute 0.1 0.0 - 0.7 K/uL   Basophils Relative 0 %   Basophils Absolute 0.0 0.0 - 0.1 K/uL  Protime-INR  Result Value Ref Range   Prothrombin Time 13.8 11.6 - 15.2 seconds   INR 1.04 0.00 - 1.49    Chest 2 View  10/21/2015  CLINICAL DATA:  Asthma. EXAM: CHEST  2 VIEW COMPARISON:  No prior. FINDINGS: Mediastinum hilar structures normal. Lungs are clear. Heart size normal. No pleural effusion or pneumothorax. IMPRESSION: No acute cardiopulmonary disease. Electronically Signed   By: Maisie Fus  Register   On: 10/21/2015 13:59   Dg Lumbar Spine 2-3 Views  10/23/2015  CLINICAL DATA:  Left L4-5 microdiskectomy. EXAM: LUMBAR SPINE - 2-3 VIEW COMPARISON:  Lumbar spine MRI dated 10/01/2015. FINDINGS: Two cross-table lateral portable films of the lumbar spine are provided. Final image shows a surgical instrument overlying the L4-5 interspinous space. IMPRESSION: Cross-table lateral film showing a surgical instrument overlying the L4-5 interspinous space, compatible with given  history of L4-5 microdiskectomy. Electronically Signed   By: Bary Richard M.D.   On: 10/23/2015 15:05   Mr Lumbar Spine Wo Contrast  10/01/2015  ADDENDUM REPORT: 10/01/2015 09:21 ADDENDUM: Study discussed by telephone with Dr. Pati Gallo on 10/01/2015 at 0908 hours. Electronically Signed   By: Odessa Fleming M.D.   On: 10/01/2015 09:21  10/01/2015  CLINICAL DATA:  20 year old female with lumbar back pain radiating down the left lower extremity for 2 months, and now with symptoms also on the right side. Two spinal steroid injections without relief. No specific injury, but has increased activity recently in an attempt to lose weight. Subsequent encounter. EXAM: MRI LUMBAR SPINE WITHOUT CONTRAST TECHNIQUE: Multiplanar, multisequence  MR imaging of the lumbar spine was performed. No intravenous contrast was administered. COMPARISON:  None. FINDINGS: Large body habitus. Study is intermittently degraded by motion artifact despite repeated imaging attempts. Lumbar segmentation appears to be normal and will be designated as such for this report. Straightening of lordosis. No marrow edema or evidence of acute osseous abnormality. Visualized lower thoracic spinal cord is normal with conus medularis at L1. Negative visualized abdominal viscera. Partially visible left hemipelvis 3.5 cm or larger area of fluid probably is a physiologic left adnexal cyst. T12-L1:  Negative. L1-L2:  Negative. L2-L3: Posteriorly situated facet related synovial cyst on the right best seen on series 11, image 8. This should not cause neural compromise. Mild facet hypertrophy. No stenosis. L3-L4: Mild to moderate facet hypertrophy. No stenosis.L4-L5: Bulky disc extrusion with broad-based component occupying the spinal canal and cephalad migration of material in the midline. See series 7, image 8 and series 12, image 7. Severe spinal and left greater than right lateral recess stenosis. No definite L4 foraminal involvement. Superimposed mild facet hypertrophy. L5-S1: Circumferential disc bulge with chronic appearing broad-based right paracentral to lateral recess disc protrusion (chronic in light of associated endplate spurring best seen on series 7, image 7. Mild facet hypertrophy. Trace facet joint fluid on the right. Moderate to severe right lateral recess stenosis (series 12, image 13). Otherwise no significant spinal stenosis. Endplate spurring resulting in up to mild right L5 foraminal stenosis. IMPRESSION: 1. Large L4-L5 disc extrusion with severe left greater than right spinal and lateral recess stenosis. 2. More chronic appearing disc and endplate degeneration at L5-S1 (more chronic in light of associated posterior endplate spurring). Moderate to severe right lateral  recess stenosis at that level and up to mild right L5 foraminal stenosis. Electronically Signed: By: Odessa Fleming M.D. On: 10/01/2015 08:00    Antibiotics:  Anti-infectives    Start     Dose/Rate Route Frequency Ordered Stop   10/23/15 2100  ceFAZolin (ANCEF) 3 g in dextrose 5 % 50 mL IVPB     3 g 130 mL/hr over 30 Minutes Intravenous Every 8 hours 10/23/15 1659 10/24/15 0452   10/23/15 1400  ceFAZolin (ANCEF) 3 g in dextrose 5 % 50 mL IVPB     3 g 130 mL/hr over 30 Minutes Intravenous To [facility] 10/22/15 1426 10/23/15 1315   10/23/15 1250  bacitracin 50,000 Units in sodium chloride irrigation 0.9 % 500 mL irrigation  Status:  Discontinued       As needed 10/23/15 1353 10/23/15 1458      Discharge Exam: Blood pressure 102/55, pulse 69, temperature 98.3 F (36.8 C), temperature source Oral, resp. rate 20, height  (1.854 m), weight 160.936 kg (354 lb 12.8 oz), SpO2 100 %. Neurologic: Grossly normal Dressing dry  Discharge Medications:     Medication List    TAKE these medications        HYDROcodone-acetaminophen 5-325 MG tablet  Commonly known as:  NORCO/VICODIN  Take 1-2 tablets by mouth every 4 (four) hours as needed.     methocarbamol 500 MG tablet  Commonly known as:  ROBAXIN  Take 1 tablet (500 mg total) by mouth every 6 (six) hours as needed for muscle spasms.        Disposition: home   Final Dx: microdiskectomy      Discharge Instructions     Remove dressing in 72 hours    Complete by:  As directed      Call MD for:  difficulty breathing, headache or visual disturbances    Complete by:  As directed      Call MD for:  persistant nausea and vomiting    Complete by:  As directed      Call MD for:  redness, tenderness, or signs of infection (pain, swelling, redness, odor or green/yellow discharge around incision site)    Complete by:  As directed      Call MD for:  severe uncontrolled pain    Complete by:  As directed      Call MD for:  temperature  >100.4    Complete by:  As directed      Diet - low sodium heart healthy    Complete by:  As directed      Discharge instructions    Complete by:  As directed   No bending or twisting, no heavy lifting, no driving, may shower     Increase activity slowly    Complete by:  As directed            Follow-up Information    Follow up with Rowene Suto S, MD. Schedule an appointment as soon as possible for a visit in 3 weeks.   Specialty:  Neurosurgery   Contact information:   [address] [place] [address] [phone]        Signed: Tia Alert 10/24/2015, 7:32 AM

## 2016-04-01 IMAGING — CR DG LUMBAR SPINE 2-3V
2 series · 2 of 2 positions shown · non-contrast
Comparison: Lumbar spine MRI dated 10/01/2015.

CLINICAL DATA: Left L4-5 microdiskectomy.

EXAM:
LUMBAR SPINE - 2-3 VIEW

[lat (1 of 2)]
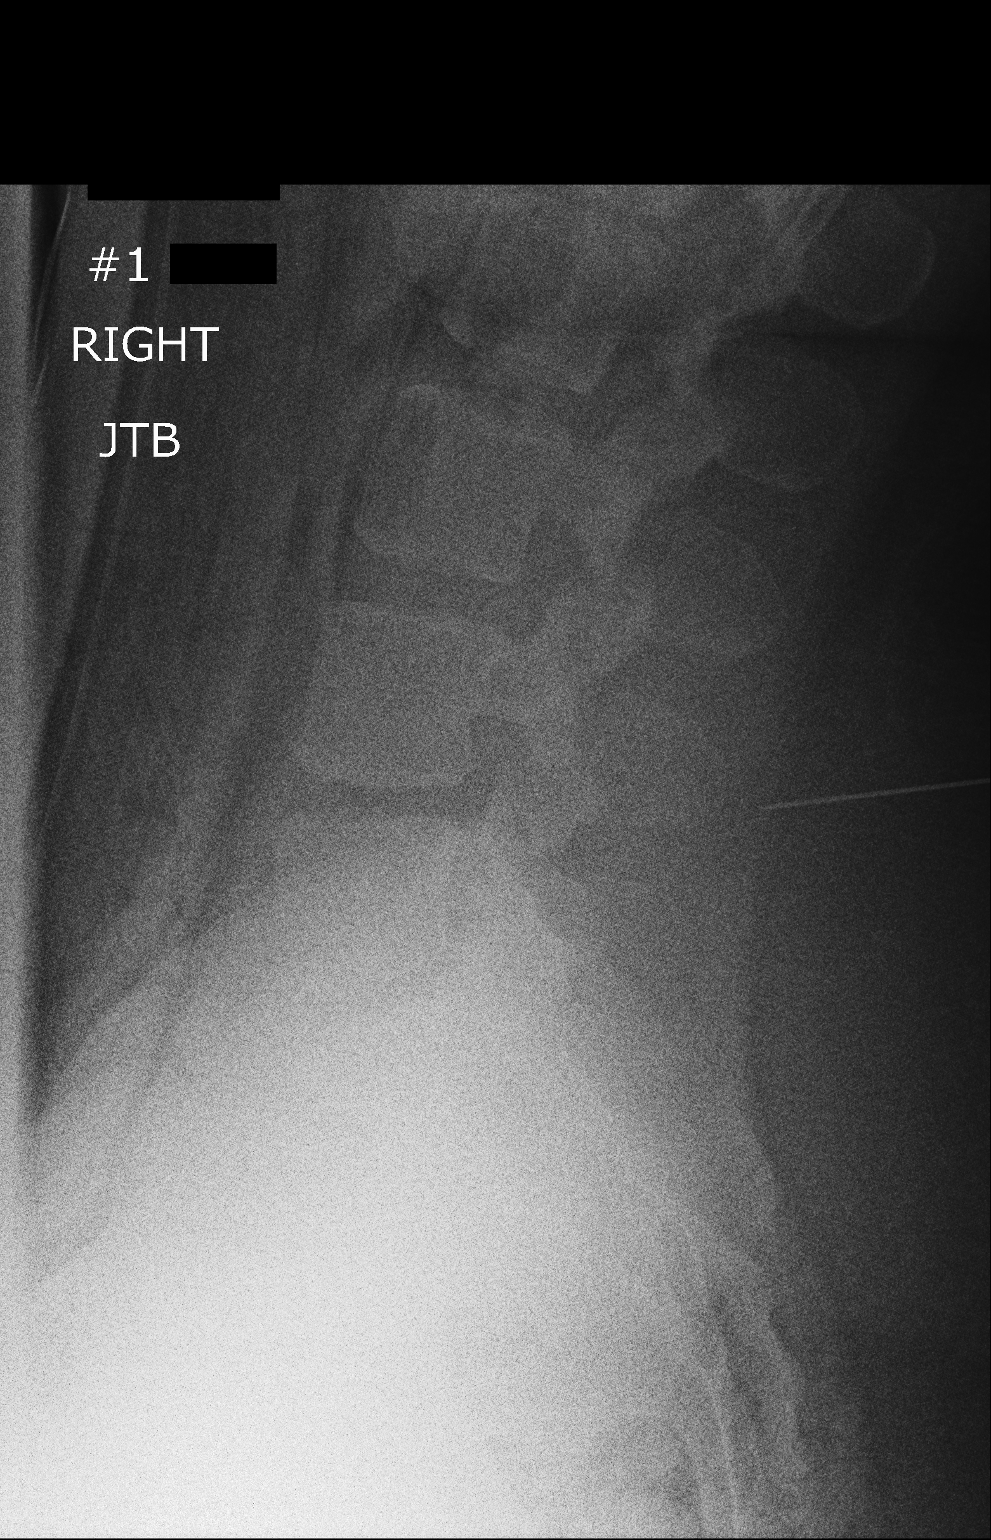

[lat (2 of 2)]
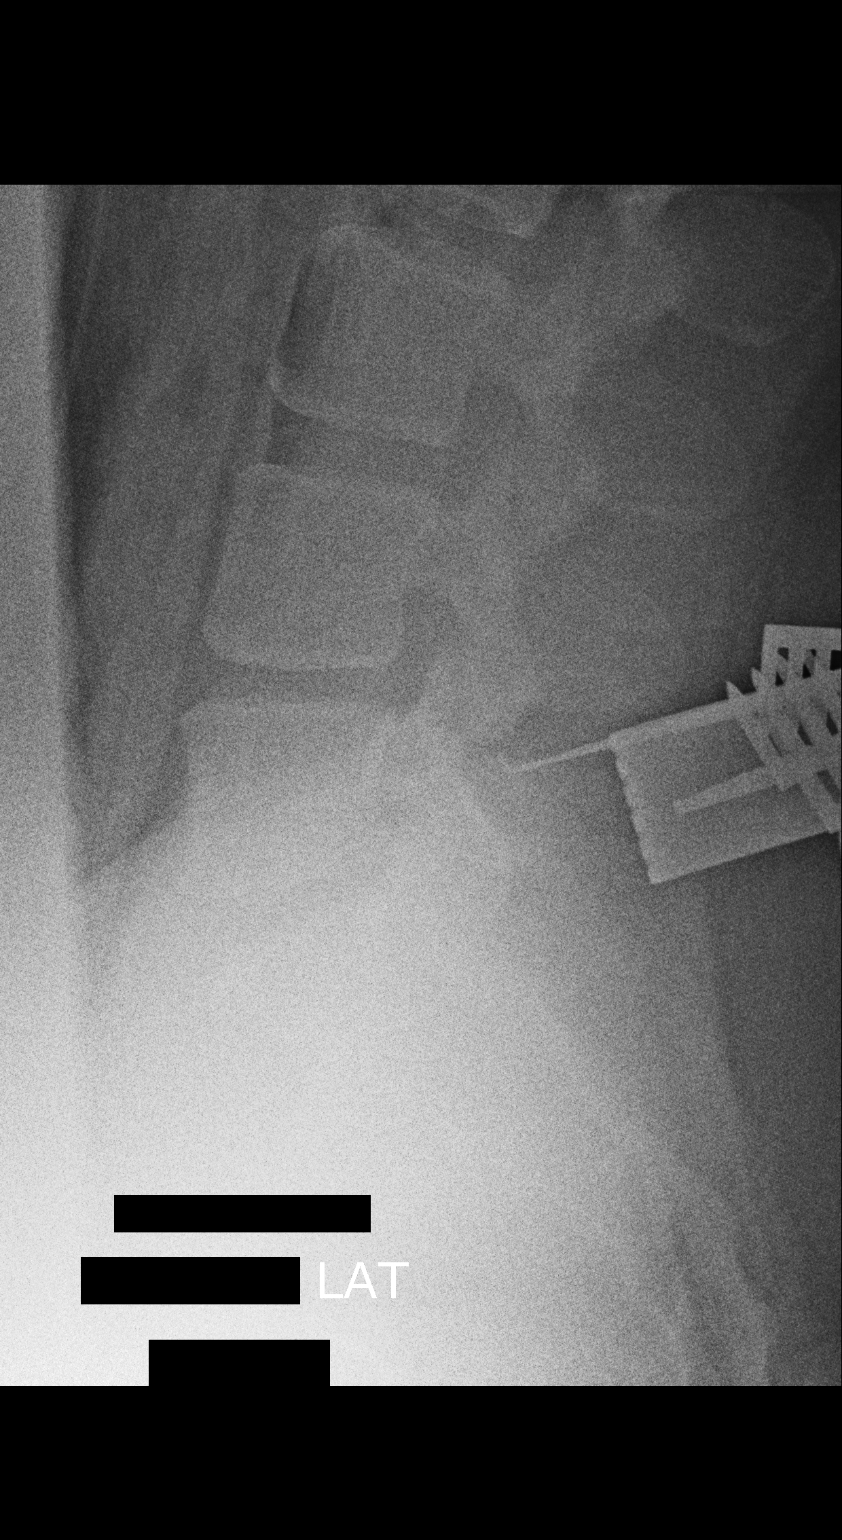

[2 of 2 positions shown; findings below may reference images not displayed]

FINDINGS: Two cross-table lateral portable films of the lumbar spine are
provided. Final image shows a surgical instrument overlying the L4-5
interspinous space.
IMPRESSION: Cross-table lateral film showing a surgical instrument overlying the
L4-5 interspinous space, compatible with given history of L4-5
microdiskectomy.

## 2016-04-16 ENCOUNTER — Encounter (HOSPITAL_COMMUNITY): Payer: Self-pay | Admitting: Emergency Medicine

## 2016-04-16 ENCOUNTER — Emergency Department (HOSPITAL_COMMUNITY)
Admission: EM | Admit: 2016-04-16 | Discharge: 2016-04-17 | Disposition: A | Discharge status: Home / Self Care | Payer: Medicaid Other | Attending: Emergency Medicine | Admitting: Emergency Medicine

## 2016-04-16 DIAGNOSIS — Y939 Activity, unspecified: Secondary | ICD-10-CM | POA: Insufficient documentation

## 2016-04-16 DIAGNOSIS — J45909 Unspecified asthma, uncomplicated: Secondary | ICD-10-CM | POA: Insufficient documentation

## 2016-04-16 DIAGNOSIS — X781XXA Intentional self-harm by knife, initial encounter: Secondary | ICD-10-CM | POA: Insufficient documentation

## 2016-04-16 DIAGNOSIS — Y999 Unspecified external cause status: Secondary | ICD-10-CM | POA: Insufficient documentation

## 2016-04-16 DIAGNOSIS — Z7289 Other problems related to lifestyle: Secondary | ICD-10-CM

## 2016-04-16 DIAGNOSIS — S61512A Laceration without foreign body of left wrist, initial encounter: Secondary | ICD-10-CM | POA: Insufficient documentation

## 2016-04-16 DIAGNOSIS — F4321 Adjustment disorder with depressed mood: Secondary | ICD-10-CM | POA: Insufficient documentation

## 2016-04-16 DIAGNOSIS — IMO0002 Reserved for concepts with insufficient information to code with codable children: Secondary | ICD-10-CM

## 2016-04-16 DIAGNOSIS — Y929 Unspecified place or not applicable: Secondary | ICD-10-CM | POA: Insufficient documentation

## 2016-04-16 DIAGNOSIS — Z87891 Personal history of nicotine dependence: Secondary | ICD-10-CM | POA: Insufficient documentation

## 2016-04-16 LAB — CBC WITH DIFFERENTIAL/PLATELET
BASOS PCT: 0 %
Basophils Absolute: 0 10*3/uL (ref 0.0–0.1)
EOS ABS: 0.2 10*3/uL (ref 0.0–0.7)
Eosinophils Relative: 2 %
HCT: 39.9 % (ref 36.0–46.0)
Hemoglobin: 13.1 g/dL (ref 12.0–15.0)
Lymphocytes Relative: 13 %
Lymphs Abs: 1.7 10*3/uL (ref 0.7–4.0)
MCH: 28.4 pg (ref 26.0–34.0)
MCHC: 32.8 g/dL (ref 30.0–36.0)
MCV: 86.4 fL (ref 78.0–100.0)
MONO ABS: 0.9 10*3/uL (ref 0.1–1.0)
MONOS PCT: 7 %
NEUTROS PCT: 78 %
Neutro Abs: 10.2 10*3/uL — ABNORMAL HIGH (ref 1.7–7.7)
Platelets: 260 10*3/uL (ref 150–400)
RBC: 4.62 MIL/uL (ref 3.87–5.11)
RDW: 13.9 % (ref 11.5–15.5)
WBC: 13 10*3/uL — ABNORMAL HIGH (ref 4.0–10.5)

## 2016-04-16 LAB — COMPREHENSIVE METABOLIC PANEL
ALBUMIN: 4.3 g/dL (ref 3.5–5.0)
ALT: 24 U/L (ref 14–54)
ANION GAP: 6 (ref 5–15)
AST: 32 U/L (ref 15–41)
Alkaline Phosphatase: 45 U/L (ref 38–126)
BUN: 13 mg/dL (ref 6–20)
CO2: 24 mmol/L (ref 22–32)
Calcium: 9.2 mg/dL (ref 8.9–10.3)
Chloride: 108 mmol/L (ref 101–111)
Creatinine, Ser: 0.59 mg/dL (ref 0.44–1.00)
GFR calc Af Amer: >60 mL/min (ref >60)
GFR calc non Af Amer: >60 mL/min (ref >60)
GLUCOSE: 103 mg/dL — AB (ref 65–99)
POTASSIUM: 4 mmol/L (ref 3.5–5.1)
SODIUM: 138 mmol/L (ref 135–145)
TOTAL PROTEIN: 8 g/dL (ref 6.5–8.1)
Total Bilirubin: 0.3 mg/dL (ref 0.3–1.2)

## 2016-04-16 LAB — RAPID URINE DRUG SCREEN, HOSP PERFORMED
Amphetamines: NOT DETECTED
Barbiturates: NOT DETECTED
Benzodiazepines: NOT DETECTED
Cocaine: NOT DETECTED
Opiates: NOT DETECTED
Tetrahydrocannabinol: NOT DETECTED

## 2016-04-16 LAB — ETHANOL: Alcohol, Ethyl (B): <5 mg/dL

## 2016-04-16 MED ORDER — LORAZEPAM 1 MG PO TABS: 1.0000 mg | ORAL_TABLET | Freq: Three times a day (TID) | ORAL | Status: DC | PRN

## 2016-04-16 MED ORDER — ZOLPIDEM TARTRATE 5 MG PO TABS: 5.0000 mg | ORAL_TABLET | Freq: Every evening | ORAL | Status: DC | PRN

## 2016-04-16 MED ORDER — ONDANSETRON HCL 4 MG PO TABS: 4.0000 mg | ORAL_TABLET | Freq: Three times a day (TID) | ORAL | Status: DC | PRN

## 2016-04-16 MED ORDER — ALUM & MAG HYDROXIDE-SIMETH 200-200-20 MG/5ML PO SUSP: 30.0000 mL | ORAL | Status: DC | PRN

## 2016-04-16 MED ORDER — IBUPROFEN 200 MG PO TABS
600.0000 mg | ORAL_TABLET | Freq: Three times a day (TID) | ORAL | Status: DC | PRN
  Administered 2016-04-16: 600 mg via ORAL
  Filled 2016-04-16: qty 3

## 2016-04-16 MED ORDER — HYDROCODONE-ACETAMINOPHEN 5-325 MG PO TABS
1.0000 | ORAL_TABLET | Freq: Once | ORAL | Status: AC
  Administered 2016-04-16: 1 via ORAL
  Filled 2016-04-16: qty 1

## 2016-04-16 MED ORDER — NICOTINE 21 MG/24HR TD PT24
21.0000 mg | MEDICATED_PATCH | Freq: Every day | TRANSDERMAL | Status: DC
  Filled 2016-04-16: qty 1

## 2016-04-16 MED ORDER — ACETAMINOPHEN 325 MG PO TABS
650.0000 mg | ORAL_TABLET | ORAL | Status: DC | PRN
  Administered 2016-04-17: 650 mg via ORAL
  Filled 2016-04-16: qty 2

## 2016-04-16 MED ORDER — LIDOCAINE-EPINEPHRINE 2 %-1:100000 IJ SOLN
20.0000 mL | Freq: Once | INTRAMUSCULAR | Status: AC
  Administered 2016-04-16: 20 mL via INTRADERMAL
  Filled 2016-04-16: qty 1

## 2016-04-16 NOTE — ED Provider Notes (Signed)
CSN: [id number]     Arrival date & time 04/16/16  1839 History   First MD Initiated Contact with Patient 04/16/16 1856     Chief Complaint  Patient presents with  . Extremity Laceration     (Consider location/radiation/quality/duration/timing/severity/associated sxs/prior Treatment) HPI   20 year old female presenting for evaluation of wrist injury. Patient is a poor historian. Patient states her sister encouraged her to come to the ER when she she found out the patient has a laceration to her left wrist. Patient states she "accidentally" cut her wrists with a knife. She refused to go into detail as to what has happened. She states that she is not trying to harm herself and denies any homicidal ideation, auditory or visual hallucination. She denies any prior history of self-harm. When asked if she is eating and sleeping appropriately, patient nodded yes and when asked if the patient is depressed, patient states she is not. However, nurse note that her sister patient lives with received a text from patient today saying "I love you".  Pt denies self medicating with alcohol or street drugs.  Pt is tearful.  History is limited.  She is UTD with tetanus.   Past Medical History  Diagnosis Date  . Asthma     rare use of inhaler, last time couple of yrs. ago  . Family history of adverse reaction to anesthesia     sister- wakes up during surgery   Past Surgical History  Procedure Laterality Date  . Upper gi endoscopy      done as a child, told wnl  . Lumbar laminectomy/decompression microdiscectomy Left 10/23/2015    Procedure: LUMBAR LAMINECTOMY/DECOMPRESSION MICRODISCECTOMY LUMBAR FOUR-FIVE;  Surgeon: Tia Alertavid S Jones, MD;  Location: [facility];  Service: Neurosurgery;  Laterality: Left;   Family History  Problem Relation Age of Onset  . Cancer Mother   . Asthma Mother   . Diabetes Other   . Hypertension Other    Social History  Substance Use Topics  . Smoking status: Never Smoker   .  Smokeless tobacco: None  . Alcohol Use: Yes     Comment: social, occasionally    OB History    No data available     Review of Systems  Constitutional: Negative for fever.  Skin: Positive for wound.  Neurological: Negative for numbness.  All other systems reviewed and are negative.     Allergies  Review of patient's allergies indicates no known allergies.  Home Medications   Prior to Admission medications   Medication Sig Start Date End Date Taking? Authorizing Provider  HYDROcodone-acetaminophen (NORCO/VICODIN) 5-325 MG tablet Take 1-2 tablets by mouth every 4 (four) hours as needed. 10/24/15   Tia Alertavid S Jones, MD  methocarbamol (ROBAXIN) 500 MG tablet Take 1 tablet (500 mg total) by mouth every 6 (six) hours as needed for muscle spasms. 10/24/15   Tia Alertavid S Jones, MD   BP 135/115 mmHg  Pulse 110  Temp(Src) 99.8 F (37.7 C) (Oral)  Resp 20  Ht 6' (1.829 m)  Wt 149.687 kg  BMI 44.75 kg/m2  SpO2 100%  LMP 02/09/2016 Physical Exam  Constitutional: She appears well-developed and well-nourished. No distress.  HENT:  Head: Atraumatic.  Eyes: Conjunctivae are normal.  Neck: Neck supple.  Musculoskeletal: She exhibits tenderness (Left wrist: A 2 cm superficial vertical laceration noted to the full aspect of wrist without any neurovascular compromise.).  Neurological: She is alert.  Skin: No rash noted.  Psychiatric: She exhibits a depressed mood.  Pt  is tearful.  Poor eye contact.    Nursing note and vitals reviewed.   ED Course  Procedures (including critical care time) Labs Review Labs Reviewed  CBC WITH DIFFERENTIAL/PLATELET - Abnormal; Notable for the following:    WBC 13.0 (*)    Neutro Abs 10.2 (*)    All other components within normal limits  COMPREHENSIVE METABOLIC PANEL - Abnormal; Notable for the following:    Glucose, Bld 103 (*)    All other components within normal limits  ETHANOL  URINE RAPID DRUG SCREEN, HOSP PERFORMED  POC URINE PREG, ED     MDM    Final diagnoses:  Wrist laceration, left, initial encounter  Injury, self-inflicted    BP 135/115 mmHg  Pulse 110  Temp(Src) 99.8 F (37.7 C) (Oral)  Resp 20  Ht 6' (1.829 m)  Wt 149.687 kg  BMI 44.75 kg/m2  SpO2 100%  LMP 02/09/2016   7:13 PM Patient is here for evaluation of her laceration to the volar aspect of the left wrist. She is right-hand dominant. Laceration is superficial and does not appear to be caused by an accident. I'm concerned of self-harm or suicidal intent however patient adamantly denies trying to harm herself. She refused to give me any more information.  7:43 PM I had the chance to talk to pt's sister and mom in private.  They report pt has been depressed and have been acting out lately.  Some of it is due to missing her biological mom.  She also wants to go to the shooting range. She also bought numbing cream recently (suspicious of premeditating of cutting herself).  Family member is concern that she may harm herself.  In light of this conversation, if pt unwilling to stay for psychiatric help, family member agrees to file IVC paper.    8:42 PM  Laceration was repaired by me.  I expressed to pt that i am worried about her attempted SI.  I would like to have TTS to evaluate her.  She agrees.  She understand if she wants to leave then she will be IVCed.  Care discussed with Dr. Dalene Seltzer.    LACERATION REPAIR Performed by: Fayrene Helper Authorized byFayrene Helper Consent: Verbal consent obtained. Risks and benefits: risks, benefits and alternatives were discussed Consent given by: patient Patient identity confirmed: provided demographic data Prepped and Draped in normal sterile fashion Wound explored  Laceration Location: L wrist, volar (superficial)  Laceration Length: 2cm  No Foreign Bodies seen or palpated  Anesthesia: local infiltration  Local anesthetic: lidocaine 2% w epinephrine  Anesthetic total: 2 ml  Irrigation method: syringe Amount  of cleaning: standard  Skin closure: prolene 5.0  Number of sutures: 5  Technique: simple interrupted  Patient tolerance: Patient tolerated the procedure well with no immediate complications.  9:44 PM Psych holding order filled.  TTS will evaluate pt.  Sutures will need to be remove in 5-7 days.  Pt will need to monitor wound for signs of infection.   Fayrene Helper, PA-C 04/17/16 0031  Alvira Monday, MD 04/17/16 [phone]

## 2016-04-16 NOTE — ED Notes (Signed)
Belongings given to family, and taken home with them.  RN notified.

## 2016-04-16 NOTE — ED Notes (Addendum)
Per EMS, patient is from home.  Patient cut her left wrist and denies it was intentional.  Minimal bleeding.  Patient states she was playing around with the knife.  She states she does not wish to harm herself.  She misses her mom.   BP:110 palpated HR:110 R:18 98% on room air

## 2016-04-16 NOTE — BH Assessment (Addendum)
Assessment Note  Sarah Hatfield is an 20 y.o. female presenting voluntarily to [facility] for lacerations to her left wrist reporting that she was "aggravated" and reporting that she misses her grandmother. Patient states "I just didn't want to be stressed and I was aggravated, I'm not crazy, I don't want to die, I was really aggravated. I don't know how to explain it." Patient states that her sister saw her bleeding and contacted 911.  Patient denies SI and history of attempts. Patient states that this is the first time that she has self-injured and feels like "I did something dumb" and does not plan to do it again. Patient denies HI and history of aggression. Patient denies access to firearms. Patient denies pending charges and upcoming court dates. Patient denies active probation. Patient denies AVH and does not appear to be responding to internal stimuli. Patient denies recent stressors but states that she has been missing her grandmother who passed away six years ago. Patient denies other stressors or feeling depressed. Patient states that she feels irritable and denies other symptoms of depression. Patient contracts for safety at this time.   Patient denies use of drugs and alcohol. Patient UDS and BAL clear at time of assessment. Patient denies previous inpatient or outpatient mental health treatment.   Consulted with Maryjean Morn, PA-C who recommends overnight observation and evaluation by psychiatry in the morning.   Diagnosis: Adjustment Disorder with depressed mood   Past Medical History:  Past Medical History  Diagnosis Date  . Asthma     rare use of inhaler, last time couple of yrs. ago  . Family history of adverse reaction to anesthesia     sister- wakes up during surgery    Past Surgical History  Procedure Laterality Date  . Upper gi endoscopy      done as a child, told wnl  . Lumbar laminectomy/decompression microdiscectomy Left 10/23/2015    Procedure: LUMBAR  LAMINECTOMY/DECOMPRESSION MICRODISCECTOMY LUMBAR FOUR-FIVE;  Surgeon: Tia Alert, MD;  Location: [facility];  Service: Neurosurgery;  Laterality: Left;    Family History:  Family History  Problem Relation Age of Onset  . Cancer Mother   . Asthma Mother   . Diabetes Other   . Hypertension Other     Social History:  reports that she has never smoked. She does not have any smokeless tobacco history on file. She reports that she drinks alcohol. She reports that she does not use illicit drugs.  Additional Social History:  Alcohol / Drug Use Pain Medications: Denies Prescriptions: Denies Over the Counter: Denies History of alcohol / drug use?: No history of alcohol / drug abuse  CIWA: CIWA-Ar BP: (!) 135/115 mmHg Pulse Rate: 110 COWS:    Allergies: No Known Allergies  Home Medications:  (Not in a hospital admission)  OB/GYN Status:  Patient's last menstrual period was 02/09/2016.  General Assessment Data [facility] of [facility]: [facility] T[facility] Assessment: In system Is this a Tele or Face-to-Face Assessment?: Face-to-Face Is this an Initial Assessment or a Re-assessment for this encounter?: Initial Assessment Marital status: Single Is patient pregnant?: No Pregnancy Status: No Living Arrangements: Other relatives (sister) Can pt return to current living arrangement?: Yes Admission Status: Voluntary Is patient capable of signing voluntary admission?: Yes Referral Source: Self/Family/Friend     Crisis Care Plan Living Arrangements: Other relatives (sister) Name of Psychiatrist: None Name of Therapist: None  Education Status Is patient currently in school?: No Highest grade of school patient has completed: Job [employer]  Risk to self with the past 6 months Suicidal Ideation: No Has patient been a risk to self within the past 6 months prior to admission? : No Suicidal Intent: No Has patient had any suicidal intent within the past 6 months prior to admission? : No Is  patient at risk for suicide?: No Suicidal Plan?: No Has patient had any suicidal plan within the past 6 months prior to admission? : No Access to Means: No What has been your use of drugs/alcohol within the last 12 months?: Denies Previous Attempts/Gestures: No How many times?: 0 Other Self Harm Risks: cutting (1st time tonight) Triggers for Past Attempts: None known Intentional Self Injurious Behavior: Cutting Comment - Self Injurious Behavior: cut self tonight Family Suicide History: No Persecutory voices/beliefs?: No Depression: No (denies) Depression Symptoms: Feeling angry/irritable Substance abuse history and/or treatment for substance abuse?: No Suicide prevention information given to non-admitted patients: Not applicable  Risk to Others within the past 6 months Homicidal Ideation: No Does patient have any lifetime risk of violence toward others beyond the six months prior to admission? : No Thoughts of Harm to Others: No Current Homicidal Intent: No Current Homicidal Plan: No Access to Homicidal Means: No Identified Victim: Denies History of harm to others?: No Assessment of Violence: None Noted Violent Behavior Description: Denies Does patient have access to weapons?: No Criminal Charges Pending?: No Does patient have a court date: No Is patient on probation?: No  Psychosis Hallucinations: None noted Delusions: None noted  Mental Status Report Appearance/Hygiene: Unremarkable Eye Contact: Fair Motor Activity: Unable to assess Speech: Logical/coherent Level of Consciousness: Alert Mood: Pleasant Affect: Appropriate to circumstance Anxiety Level: None Thought Processes: Coherent, Relevant Judgement: Unimpaired Orientation: Person, Place, Time, Situation, Appropriate for developmental age Obsessive Compulsive Thoughts/Behaviors: None  Cognitive Functioning Concentration: Good Memory: Recent Intact, Remote Intact IQ: Average Insight: Fair Impulse Control:  Fair Appetite: Good Sleep: Decreased Total Hours of Sleep: 6 Vegetative Symptoms: None  ADLScreening [facility] Assessment Services) Patient's cognitive ability adequate to safely complete daily activities?: Yes Patient able to express need for assistance with ADLs?: Yes Independently performs ADLs?: Yes (appropriate for developmental age)  Prior Inpatient Therapy Prior Inpatient Therapy: No Prior Therapy Dates: N/A Prior Therapy Facilty/Provider(s): N/A Reason for Treatment: N/A  Prior Outpatient Therapy Prior Outpatient Therapy: No Prior Therapy Dates: N/A Prior Therapy Facilty/Provider(s): N/A Reason for Treatment: N/A Does patient have an ACCT team?: No Does patient have Intensive In-House Services?  : No Does patient have [facility] services? : No Does patient have P4CC services?: No  ADL Screening (condition at time of admission) Patient's cognitive ability adequate to safely complete daily activities?: Yes Is the patient deaf or have difficulty hearing?: No Does the patient have difficulty seeing, even when wearing glasses/contacts?: No Does the patient have difficulty concentrating, remembering, or making decisions?: No Patient able to express need for assistance with ADLs?: Yes Does the patient have difficulty dressing or bathing?: No Independently performs ADLs?: Yes (appropriate for developmental age) Does the patient have difficulty walking or climbing stairs?: No Weakness of Legs: None Weakness of Arms/Hands: None  Home Assistive Devices/Equipment Home Assistive Devices/Equipment: None  Therapy Consults (therapy consults require a physician order) PT Evaluation Needed: No OT Evalulation Needed: No Abuse/Neglect Assessment (Assessment to be complete while patient is alone) Physical Abuse: Denies Verbal Abuse: Denies Sexual Abuse: Denies Exploitation of patient/patient's resources: Denies Self-Neglect: Denies Values / Beliefs Cultural Requests During  Hospitalization: None Spiritual Requests During Hospitalization: None Consults Spiritual Care Consult Needed:  No Social Work Consult Needed: No Merchant navy officer (For Healthcare) Does patient have an advance directive?: No Would patient like information on creating an advanced directive?: No - patient declined information    Additional Information 1:1 In Past 12 Months?: No CIRT Risk: No Elopement Risk: No Does patient have medical clearance?: Yes     Disposition:  Disposition Initial Assessment Completed for this Encounter: Yes Disposition of Patient: Other dispositions (AM psych evaluation per Maryjean Morn, PA-C) Other disposition(s): Other (Comment)  On Site Evaluation by:   Reviewed with Physician:    Olesya Wike 04/17/2016 12:35 AM

## 2016-04-16 NOTE — ED Notes (Addendum)
Mother- [name]

## 2016-04-16 NOTE — ED Notes (Signed)
Pt wanded by security. 

## 2016-04-16 NOTE — BH Assessment (Signed)
Assessment completed. Consulted with Maryjean Mornharles Kober, PA-C who recommends overnight observation and evaluation in the morning.   Davina PokeJoVea Meridian Scherger, LCSW Therapeutic Triage Specialist [facility] 04/16/2016 9:52 PM

## 2016-04-17 DIAGNOSIS — F4321 Adjustment disorder with depressed mood: Secondary | ICD-10-CM | POA: Diagnosis present

## 2016-04-17 NOTE — [facility] Suicide Risk Assessment (Signed)
Suicide Risk Assessment  Discharge Assessment   [facility] Discharge Suicide Risk Assessment   Principal Problem: Adjustment disorder with depressed mood Discharge Diagnoses:  Patient Active Problem List   Diagnosis Date Noted  . Adjustment disorder with depressed mood [F43.21] 04/17/2016  . S/P lumbar laminectomy [Z98.890] 10/23/2015    Total Time spent with patient: 20 minutes  Musculoskeletal: Strength & Muscle Tone: within normal limits Gait & Station: normal Patient leans: N/A  Psychiatric Specialty Exam:   Blood pressure 115/52, pulse 74, temperature 98 F (36.7 C), temperature source Oral, resp. rate 17, height 6' (1.829 m), weight 149.687 kg (330 lb), last menstrual period 02/09/2016, SpO2 96 %.Body mass index is 44.75 kg/(m^2).   General Appearance: Fairly Groomed  Eye Contact:  Fair  Speech:  Clear and Coherent and Normal Rate  Volume:  Normal  Mood:  Depressed  Affect:  Depressed  Thought Process:  Coherent and Goal Directed  Orientation:  Full (Time, Place, and Person)  Thought Content:  Logical  Suicidal Thoughts:  No  Homicidal Thoughts:  No  Memory:  Immediate;   Good Recent;   Good Remote;   Good  Judgement:  Intact  Insight:  Present  Psychomotor Activity:  Normal  Concentration:  Concentration: Good and Attention Span: Good  Recall:  Good  Fund of Knowledge:  Good  Language:  Good  Akathisia:  No  Handed:  Right  AIMS (if indicated):     Assets:  Communication Skills Desire for Improvement Housing Resilience  ADL's:  Intact  Cognition:  WNL  Sleep:      Mental Status Per Nursing Assessment::   On Admission:     Demographic Factors:  Adolescent or young adult  Loss Factors: Loss of significant relationship  Historical Factors: NA  Risk Reduction Factors:   Employed and Living with another person, especially a relative  Continued Clinical Symptoms:  Depression  Cognitive Features That Contribute To Risk:  None    Suicide Risk:   Minimal: No identifiable suicidal ideation.  Patients presenting with no risk factors but with morbid ruminations; may be classified as minimal risk based on the severity of the depressive symptoms  Follow-up Information    Follow up with follow up with [facility] for counseling. Schedule an appointment as soon as possible for a visit in 2 days.   Contact information:   [address] [place] [address] [phone]      Plan Of Care/Follow-up recommendations:  Activity:  As tolerated Diet:  Regular Tests:  As determined by PCP Other:     In the event of worsening symptoms, call 911, the crisis hotline or go to nearest emergency room for evaluation of symptoms.  Alberteen Sam, FNP-BC [facility] 04/17/2016, 11:52 AM

## 2016-04-17 NOTE — ED Notes (Signed)
MD at bedside. 

## 2016-04-17 NOTE — ED Notes (Signed)
Patient refuses to eat, drink, or use the bathroom.

## 2016-04-17 NOTE — ED Notes (Signed)
Awaiting on patients family for a ride home

## 2016-04-17 NOTE — Consult Note (Signed)
[facility] Face-to-Face Psychiatry Consult   Reason for Consult:  Psychiatric Evaluation Referring Physician:  EDP Patient Identification: Sarah Hatfield MRN:  [id number] Principal Diagnosis: Adjustment disorder with depressed mood Diagnosis:   Patient Active Problem List   Diagnosis Date Noted  . Adjustment disorder with depressed mood [F43.21] 04/17/2016  . S/P lumbar laminectomy [Z98.890] 10/23/2015    Total Time spent with patient: 45 minutes  Subjective:   Sarah Hatfield is a 20 y.o. female patient who states "I cut my wrist."  HPI:  Sarah Hatfield is a 20 yo [place] American female who presented to [facility] for evaluation after cutting her wrist. She is seen today face-to-face with Dr. Darleene Cleaver. She is alert, oriented and cooperative. She states she cut her left wrist "because I got aggravated."  She states her stressor is that she misses her grandmother very much and that she is sad her grandmother is no longer alive. She states she was raised by her grandmother who passed away 6 years ago. She becomes tearful when discussing her grandmother. She lives with her older sister and states they get along well. She works full-time as a Scientist, water quality.   She denies that cutting her wrist was a suicide attempt. She denies any prior suicide attempts. She has never been in therapy, counseling or seen a psychiatrist or other mental health professional. She states she drinks alcohol "occasionally" and denies that she was drinking alcohol when this incident occurred; her BAL is <5. She denies illicit drug use; UDS is negative.   She denies suicidal and homicidal ideation, intent or plan. She denies AVH.   Past Psychiatric History: None reported  Risk to Self: Suicidal Ideation: No Suicidal Intent: No Is patient at risk for suicide?: No Suicidal Plan?: No Access to Means: No What has been your use of drugs/alcohol within the last 12 months?: Denies How many times?: 0 Other Self Harm Risks:  cutting (1st time tonight) Triggers for Past Attempts: None known Intentional Self Injurious Behavior: Cutting Comment - Self Injurious Behavior: cut self tonight Risk to Others: Homicidal Ideation: No Thoughts of Harm to Others: No Current Homicidal Intent: No Current Homicidal Plan: No Access to Homicidal Means: No Identified Victim: Denies History of harm to others?: No Assessment of Violence: None Noted Violent Behavior Description: Denies Does patient have access to weapons?: No Criminal Charges Pending?: No Does patient have a court date: No Prior Inpatient Therapy: Prior Inpatient Therapy: No Prior Therapy Dates: N/A Prior Therapy Facilty/Provider(s): N/A Reason for Treatment: N/A Prior Outpatient Therapy: Prior Outpatient Therapy: No Prior Therapy Dates: N/A Prior Therapy Facilty/Provider(s): N/A Reason for Treatment: N/A Does patient have an ACCT team?: No Does patient have Intensive In-House Services?  : No Does patient have [facility] services? : No Does patient have P4CC services?: No  Past Medical History:  Past Medical History  Diagnosis Date  . Asthma     rare use of inhaler, last time couple of yrs. ago  . Family history of adverse reaction to anesthesia     sister- wakes up during surgery    Past Surgical History  Procedure Laterality Date  . Upper gi endoscopy      done as a child, told wnl  . Lumbar laminectomy/decompression microdiscectomy Left 10/23/2015    Procedure: LUMBAR LAMINECTOMY/DECOMPRESSION MICRODISCECTOMY LUMBAR FOUR-FIVE;  Surgeon: Eustace Moore, MD;  Location: [facility];  Service: Neurosurgery;  Laterality: Left;   Family History:  Family History  Problem Relation Age of Onset  . Cancer  Mother   . Asthma Mother   . Diabetes Other   . Hypertension Other    Family Psychiatric  History: Unknown Social History:  History  Alcohol Use  . Yes    Comment: social, occasionally      History  Drug Use No    Social History   Social  History  . Marital Status: Single    Spouse Name: N/A  . Number of Children: N/A  . Years of Education: N/A   Social History Main Topics  . Smoking status: Never Smoker   . Smokeless tobacco: None  . Alcohol Use: Yes     Comment: social, occasionally   . Drug Use: No  . Sexual Activity: Yes    Birth Control/ Protection: None   Other Topics Concern  . None   Social History Narrative   Additional Social History:    Allergies:  No Known Allergies  Labs:  Results for orders placed or performed during the hospital encounter of 04/16/16 (from the past 48 hour(s))  CBC with Differential/Platelet     Status: Abnormal   Collection Time: 04/16/16  8:57 PM  Result Value Ref Range   WBC 13.0 (H) 4.0 - 10.5 K/uL   RBC 4.62 3.87 - 5.11 MIL/uL   Hemoglobin 13.1 12.0 - 15.0 g/dL   HCT 39.9 36.0 - 46.0 %   MCV 86.4 78.0 - 100.0 fL   MCH 28.4 26.0 - 34.0 pg   MCHC 32.8 30.0 - 36.0 g/dL   RDW 13.9 11.5 - 15.5 %   Platelets 260 150 - 400 K/uL   Neutrophils Relative % 78 %   Neutro Abs 10.2 (H) 1.7 - 7.7 K/uL   Lymphocytes Relative 13 %   Lymphs Abs 1.7 0.7 - 4.0 K/uL   Monocytes Relative 7 %   Monocytes Absolute 0.9 0.1 - 1.0 K/uL   Eosinophils Relative 2 %   Eosinophils Absolute 0.2 0.0 - 0.7 K/uL   Basophils Relative 0 %   Basophils Absolute 0.0 0.0 - 0.1 K/uL  Comprehensive metabolic panel     Status: Abnormal   Collection Time: 04/16/16  8:57 PM  Result Value Ref Range   Sodium 138 135 - 145 mmol/L   Potassium 4.0 3.5 - 5.1 mmol/L   Chloride 108 101 - 111 mmol/L   CO2 24 22 - 32 mmol/L   Glucose, Bld 103 (H) 65 - 99 mg/dL   BUN 13 6 - 20 mg/dL   Creatinine, Ser 0.59 0.44 - 1.00 mg/dL   Calcium 9.2 8.9 - 10.3 mg/dL   Total Protein 8.0 6.5 - 8.1 g/dL   Albumin 4.3 3.5 - 5.0 g/dL   AST 32 15 - 41 U/L   ALT 24 14 - 54 U/L   Alkaline Phosphatase 45 38 - 126 U/L   Total Bilirubin 0.3 0.3 - 1.2 mg/dL   GFR calc non Af Amer >60 >60 mL/min   GFR calc Af Amer >60 >60 mL/min     Comment: (NOTE) The eGFR has been calculated using the CKD EPI equation. This calculation has not been validated in all clinical situations. eGFR's persistently <60 mL/min signify possible Chronic Kidney Disease.    Anion gap 6 5 - 15  Ethanol     Status: None   Collection Time: 04/16/16  8:58 PM  Result Value Ref Range   Alcohol, Ethyl (B) <5 <5 mg/dL    Comment:        LOWEST DETECTABLE LIMIT FOR SERUM ALCOHOL  IS 5 mg/dL FOR MEDICAL PURPOSES ONLY   Urine rapid drug screen (hosp performed)     Status: None   Collection Time: 04/16/16 10:29 PM  Result Value Ref Range   Opiates NONE DETECTED NONE DETECTED   Cocaine NONE DETECTED NONE DETECTED   Benzodiazepines NONE DETECTED NONE DETECTED   Amphetamines NONE DETECTED NONE DETECTED   Tetrahydrocannabinol NONE DETECTED NONE DETECTED   Barbiturates NONE DETECTED NONE DETECTED    Comment:        DRUG SCREEN FOR MEDICAL PURPOSES ONLY.  IF CONFIRMATION IS NEEDED FOR ANY PURPOSE, NOTIFY LAB WITHIN 5 DAYS.        LOWEST DETECTABLE LIMITS FOR URINE DRUG SCREEN Drug Class       Cutoff (ng/mL) Amphetamine      1000 Barbiturate      200 Benzodiazepine   161 Tricyclics       096 Opiates          300 Cocaine          300 THC              50     Current Facility-Administered Medications  Medication Dose Route Frequency Provider Last Rate Last Dose  . acetaminophen (TYLENOL) tablet 650 mg  650 mg Oral Q4H PRN Domenic Moras, PA-C   650 mg at 04/17/16 0800  . alum & mag hydroxide-simeth (MAALOX/MYLANTA) 200-200-20 MG/5ML suspension 30 mL  30 mL Oral PRN Domenic Moras, PA-C      . ibuprofen (ADVIL,MOTRIN) tablet 600 mg  600 mg Oral Q8H PRN Domenic Moras, PA-C   600 mg at 04/16/16 2335  . LORazepam (ATIVAN) tablet 1 mg  1 mg Oral Q8H PRN Domenic Moras, PA-C      . nicotine (NICODERM CQ - dosed in mg/24 hours) patch 21 mg  21 mg Transdermal Daily Domenic Moras, PA-C   21 mg at 04/16/16 2243  . ondansetron (ZOFRAN) tablet 4 mg  4 mg Oral Q8H PRN  Domenic Moras, PA-C      . zolpidem (AMBIEN) tablet 5 mg  5 mg Oral QHS PRN Domenic Moras, PA-C       Current Outpatient Prescriptions  Medication Sig Dispense Refill  . HYDROcodone-acetaminophen (NORCO/VICODIN) 5-325 MG tablet Take 1-2 tablets by mouth every 4 (four) hours as needed. (Patient not taking: Reported on 04/16/2016) 70 tablet 0  . methocarbamol (ROBAXIN) 500 MG tablet Take 1 tablet (500 mg total) by mouth every 6 (six) hours as needed for muscle spasms. (Patient not taking: Reported on 04/16/2016) 60 tablet 0    Musculoskeletal: Strength & Muscle Tone: within normal limits Gait & Station: normal Patient leans: N/A  Psychiatric Specialty Exam: Physical Exam  Constitutional: She is oriented to person, place, and time. She appears well-developed and well-nourished.  HENT:  Head: Normocephalic and atraumatic.  Neck: Normal range of motion.  Respiratory: Effort normal.  Neurological: She is alert and oriented to person, place, and time.  Skin: Skin is warm and dry.    Review of Systems  Constitutional: Negative.   HENT: Negative.   Eyes: Negative.   Respiratory: Negative.   Cardiovascular: Negative.   Gastrointestinal: Negative.   Genitourinary: Negative.   Musculoskeletal: Negative.   Skin:       Repaired laceration to left anterior wrist, 5 sutures intact. No erythema or drainage.    Neurological: Negative.   Endo/Heme/Allergies: Negative.   Psychiatric/Behavioral: Positive for depression.    Blood pressure 115/52, pulse 74, temperature 98 F (36.7 C),  temperature source Oral, resp. rate 17, height 6' (1.829 m), weight 149.687 kg (330 lb), last menstrual period 02/09/2016, SpO2 96 %.Body mass index is 44.75 kg/(m^2).  General Appearance: Fairly Groomed  Eye Contact:  Fair  Speech:  Clear and Coherent and Normal Rate  Volume:  Normal  Mood:  Depressed  Affect:  Depressed  Thought Process:  Coherent and Goal Directed  Orientation:  Full (Time, Place, and Person)   Thought Content:  Logical  Suicidal Thoughts:  No  Homicidal Thoughts:  No  Memory:  Immediate;   Good Recent;   Good Remote;   Good  Judgement:  Intact  Insight:  Present  Psychomotor Activity:  Normal  Concentration:  Concentration: Good and Attention Span: Good  Recall:  Good  Fund of Knowledge:  Good  Language:  Good  Akathisia:  No  Handed:  Right  AIMS (if indicated):     Assets:  Communication Skills Desire for Improvement Housing Resilience  ADL's:  Intact  Cognition:  WNL  Sleep:      Treatment Plan Summary: Case discussed with Dr. Darleene Cleaver with the following recommendations: -Discharge home with referral to [facility] to establish outpatient counseling/therapy.  Disposition: No evidence of imminent risk to self or others at present.   Patient does not meet criteria for psychiatric inpatient admission. Supportive therapy provided about ongoing stressors. Discussed crisis plan, support from social network, calling 911, coming to the Emergency Department, and calling Suicide Hotline.  Sarah Colonel, FNP-BC [place] 04/17/2016 11:47 AM Patient seen face-to-face for psychiatric evaluation, chart reviewed and case discussed with the physician extender and developed treatment plan. Reviewed the information documented and agree with the treatment plan. Sarah Pilgrim, MD

## 2016-04-18 ENCOUNTER — Encounter (HOSPITAL_COMMUNITY): Payer: Self-pay

## 2016-04-18 ENCOUNTER — Emergency Department (HOSPITAL_COMMUNITY)
Admission: EM | Admit: 2016-04-18 | Discharge: 2016-04-18 | Disposition: A | Discharge status: Home / Self Care | Payer: Medicaid Other | Attending: Emergency Medicine | Admitting: Emergency Medicine

## 2016-04-18 DIAGNOSIS — Z87891 Personal history of nicotine dependence: Secondary | ICD-10-CM | POA: Insufficient documentation

## 2016-04-18 DIAGNOSIS — J45909 Unspecified asthma, uncomplicated: Secondary | ICD-10-CM | POA: Insufficient documentation

## 2016-04-18 DIAGNOSIS — J020 Streptococcal pharyngitis: Secondary | ICD-10-CM | POA: Insufficient documentation

## 2016-04-18 LAB — RAPID STREP SCREEN (MED CTR MEBANE ONLY): Streptococcus, Group A Screen (Direct): POSITIVE — AB

## 2016-04-18 MED ORDER — HYDROCODONE-ACETAMINOPHEN 7.5-325 MG/15ML PO SOLN: 10.0000 mL | Freq: Four times a day (QID) | ORAL | 0 refills | Status: DC | PRN

## 2016-04-18 MED ORDER — IBUPROFEN 400 MG PO TABS
600.0000 mg | ORAL_TABLET | Freq: Once | ORAL | Status: AC
  Administered 2016-04-18: 600 mg via ORAL
  Filled 2016-04-18: qty 1

## 2016-04-18 MED ORDER — IBUPROFEN 800 MG PO TABS: 800.0000 mg | ORAL_TABLET | Freq: Three times a day (TID) | ORAL | 0 refills | Status: DC | PRN

## 2016-04-18 MED ORDER — PENICILLIN G BENZATHINE 1200000 UNIT/2ML IM SUSP
1.2000 10*6.[IU] | Freq: Once | INTRAMUSCULAR | Status: AC
  Administered 2016-04-18: 1.2 10*6.[IU] via INTRAMUSCULAR
  Filled 2016-04-18: qty 2

## 2016-04-18 NOTE — ED Triage Notes (Signed)
Per pt, Pt started to have a sore throat starting one week ago. Pt reports productive cough with yellow sputum. Denies nasal congestion. Denies fevers or chills.

## 2016-04-18 NOTE — Discharge Instructions (Signed)
Read the information below.  Use the prescribed medication as directed.  Please discuss all new medications with your pharmacist.  Do not take additional tylenol while taking the prescribed pain medication to avoid overdose.  You may return to the Emergency Department at any time for worsening condition or any new symptoms that concern you.  If you develop high fevers, difficulty swallowing or breathing, or you are unable to tolerate fluids by mouth, return to the ER immediately for a recheck.    °

## 2016-04-18 NOTE — ED Provider Notes (Signed)
[facility] DEPT Provider Note   First Provider Contact:  1:13 PM  By signing my name below, I, Earmon Phoenix, attest that this documentation has been prepared under the direction and in the presence of [facility] PA-C. Electronically Signed: Earmon Phoenix, ED Scribe. 04/18/16. 1:17 PM.  History   Chief Complaint Chief Complaint  Patient presents with  . Sore Throat   The history is provided by the patient and medical records. No language interpreter was used.    HPI Comments:  Sarah Hatfield is an obese 20 y.o. female who presents to the Emergency Department complaining of a sore throat that began about one week ago. She reports associated congestion and cough. She has not taken anything for the pain. Swallowing increases the pain. She denies alleviating factors. She denies fever, chills, nausea, vomiting.   Past Medical History:  Diagnosis Date  . Asthma    rare use of inhaler, last time couple of yrs. ago  . Family history of adverse reaction to anesthesia    sister- wakes up during surgery    Patient Active Problem List   Diagnosis Date Noted  . Adjustment disorder with depressed mood 04/17/2016  . S/P lumbar laminectomy 10/23/2015    Past Surgical History:  Procedure Laterality Date  . LUMBAR LAMINECTOMY/DECOMPRESSION MICRODISCECTOMY Left 10/23/2015   Procedure: LUMBAR LAMINECTOMY/DECOMPRESSION MICRODISCECTOMY LUMBAR FOUR-FIVE;  Surgeon: Tia Alert, MD;  Location: [facility];  Service: Neurosurgery;  Laterality: Left;  . UPPER GI ENDOSCOPY     done as a child, told wnl    OB History    No data available       Home Medications    Prior to Admission medications   Not on File    Family History Family History  Problem Relation Age of Onset  . Cancer Mother   . Asthma Mother   . Diabetes Other   . Hypertension Other     Social History Social History  Substance Use Topics  . Smoking status: Former Games developer  . Smokeless tobacco: Never  Used  . Alcohol use Yes     Comment: social, occasionally      Allergies   Review of patient's allergies indicates no known allergies.   Review of Systems Review of Systems  Constitutional: Negative for chills and fever.  HENT: Positive for sore throat. Negative for congestion.   Respiratory: Positive for cough.   Gastrointestinal: Negative for nausea and vomiting.  Allergic/Immunologic: Negative for immunocompromised state.  Neurological: Positive for headaches.  Hematological: Does not bruise/bleed easily.     Physical Exam Updated Vital Signs BP 126/79 (BP Location: Left Arm)   Pulse 97   Temp 98.7 F (37.1 C) (Oral)   Resp 16   Ht 6' (1.829 m)   Wt (!) 330 lb (149.7 kg)   LMP 02/09/2016   SpO2 100%   BMI 44.76 kg/m   Physical Exam  Constitutional: She appears well-developed and well-nourished. No distress.  HENT:  Head: Normocephalic and atraumatic.  Mouth/Throat: Posterior oropharyngeal edema (mild) and posterior oropharyngeal erythema present. No oropharyngeal exudate.  Eyes: Conjunctivae are normal.  Neck: Neck supple.  Cardiovascular: Normal rate and regular rhythm.   Pulmonary/Chest: Effort normal and breath sounds normal. No respiratory distress. She has no wheezes. She has no rales.  Neurological: She is alert.  Skin: She is not diaphoretic.  Nursing note and vitals reviewed.    ED Treatments / Results  Labs (all labs ordered are listed, but only abnormal results are displayed)  Labs Reviewed  RAPID STREP SCREEN (NOT AT [facility]) - Abnormal; Notable for the following:       Result Value   Streptococcus, Group A Screen (Direct) POSITIVE (*)    All other components within normal limits    EKG  EKG Interpretation None       Radiology No results found.  Procedures Procedures (including critical care time)  Medications Ordered in ED Medications  penicillin g benzathine (BICILLIN LA) 1200000 UNIT/2ML injection 1.2 Million Units (1.2 Million  Units Intramuscular Given 04/18/16 1323)  ibuprofen (ADVIL,MOTRIN) tablet 600 mg (600 mg Oral Given 04/18/16 1322)     Initial Impression / Assessment and Plan / ED Course  I have reviewed the triage vital signs and the nursing notes.  Pertinent labs & imaging results that were available during my care of the patient were reviewed by me and considered in my medical decision making (see chart for details).  Clinical Course  DIAGNOSTIC STUDIES: Oxygen Saturation is 100% on RA, normal by my interpretation.   COORDINATION OF CARE: 1:16 PM- Will order PCN injection. Pt verbalizes understanding and agrees to plan.  Medications  penicillin g benzathine (BICILLIN LA) 1200000 UNIT/2ML injection 1.2 Million Units (1.2 Million Units Intramuscular Given 04/18/16 1323)  ibuprofen (ADVIL,MOTRIN) tablet 600 mg (600 mg Oral Given 04/18/16 1322)     Afebrile, nontoxic patient with sore throat.  Strep screen positive.  No airway concerns.  Doubt peritonsillar abscess.   D/C home with antibiotics, pain medication, PCP follow up.  Discussed result, findings, treatment, and follow up  with patient.  Pt given return precautions.  Pt verbalizes understanding and agrees with plan.       I personally performed the services described in this documentation, which was scribed in my presence. The recorded information has been reviewed and is accurate.   Final Clinical Impressions(s) / ED Diagnoses   Final diagnoses:  Strep throat    New Prescriptions Discharge Medication List as of 04/18/2016  1:47 PM    START taking these medications   Details  HYDROcodone-acetaminophen (HYCET) 7.5-325 mg/15 ml solution Take 10 mLs by mouth every 6 (six) hours as needed for severe pain., Starting Sun 04/18/2016, Print    ibuprofen (ADVIL,MOTRIN) 800 MG tablet Take 1 tablet (800 mg total) by mouth every 8 (eight) hours as needed for mild pain or moderate pain., Starting Sun 04/18/2016, Print         [place],  PA-C 04/18/16 1449    Maia Plan, MD 04/18/16 [phone]

## 2016-04-18 NOTE — ED Notes (Signed)
Patient transported to X-ray 

## 2016-04-29 ENCOUNTER — Ambulatory Visit (HOSPITAL_COMMUNITY)
Admission: EM | Admit: 2016-04-29 | Discharge: 2016-04-29 | Disposition: A | Discharge status: Home / Self Care | Payer: Medicaid Other | Attending: Emergency Medicine | Admitting: Emergency Medicine

## 2016-04-29 ENCOUNTER — Encounter (HOSPITAL_COMMUNITY): Payer: Self-pay | Admitting: *Deleted

## 2016-04-29 DIAGNOSIS — Z4802 Encounter for removal of sutures: Secondary | ICD-10-CM

## 2016-04-29 NOTE — ED Provider Notes (Signed)
CSN: [id number]     Arrival date & time 04/29/16  1418 History   None    Chief Complaint  Patient presents with  . Laceration   (Consider location/radiation/quality/duration/timing/severity/associated sxs/prior Treatment) Here for suture removal. Placed to lac located left volar wrist 9 d ago. No complaints.      Past Medical History:  Diagnosis Date  . Asthma    rare use of inhaler, last time couple of yrs. ago  . Family history of adverse reaction to anesthesia    sister- wakes up during surgery   Past Surgical History:  Procedure Laterality Date  . LUMBAR LAMINECTOMY/DECOMPRESSION MICRODISCECTOMY Left 10/23/2015   Procedure: LUMBAR LAMINECTOMY/DECOMPRESSION MICRODISCECTOMY LUMBAR FOUR-FIVE;  Surgeon: Tia Alert, MD;  Location: [facility];  Service: Neurosurgery;  Laterality: Left;  . UPPER GI ENDOSCOPY     done as a child, told wnl   Family History  Problem Relation Age of Onset  . Cancer Mother   . Asthma Mother   . Diabetes Other   . Hypertension Other    Social History  Substance Use Topics  . Smoking status: Former Games developer  . Smokeless tobacco: Never Used  . Alcohol use Yes     Comment: social, occasionally    OB History    No data available     Review of Systems  Constitutional: Negative.   Skin: Positive for wound.       AS per HPI  Psychiatric/Behavioral: Negative.   All other systems reviewed and are negative.   Allergies  Review of patient's allergies indicates no known allergies.  Home Medications   Prior to Admission medications   Medication Sig Start Date End Date Taking? Authorizing Provider  HYDROcodone-acetaminophen (HYCET) 7.5-325 mg/15 ml solution Take 10 mLs by mouth every 6 (six) hours as needed for severe pain. 04/18/16   Trixie Dredge, PA-C  ibuprofen (ADVIL,MOTRIN) 800 MG tablet Take 1 tablet (800 mg total) by mouth every 8 (eight) hours as needed for mild pain or moderate pain. 04/18/16   Trixie Dredge, PA-C   Meds Ordered and  Administered this Visit  Medications - No data to display  BP 120/70 (BP Location: Right Arm)   Pulse 78   Temp 98.6 F (37 C) (Oral)   Resp 18   LMP 04/28/2016   SpO2 100%  No data found.   Physical Exam  Constitutional: She is oriented to person, place, and time. She appears well-developed and well-nourished. No distress.  Eyes: EOM are normal.  Neck: Normal range of motion. Neck supple.  Cardiovascular: Normal rate.   Pulmonary/Chest: Effort normal. No respiratory distress.  Musculoskeletal: She exhibits no edema.  Neurological: She is alert and oriented to person, place, and time. She exhibits normal muscle tone.  Skin: Skin is warm and dry.  Wound to L volar wrist healing well. No signs of infection. Sutures intact.  Psychiatric: She has a normal mood and affect.  Nursing note and vitals reviewed.   Urgent Care Course   Clinical Course    .Suture Removal Date/Time: 04/29/2016 3:14 PM Performed by: Phineas Real, Daneisha Surges Authorized by: Charm Rings   Consent:    Consent obtained:  Verbal   Consent given by:  Patient   Risks discussed:  Pain Location:    Location:  Upper extremity   Upper extremity location:  Wrist   Wrist location:  L wrist Procedure details:    Wound appearance:  Good wound healing and clean   Number of sutures removed:  5 Post-procedure details:    Patient tolerance of procedure:  Tolerated well, no immediate complications   (including critical care time)  Labs Review Labs Reviewed - No data to display  Imaging Review No results found.   Visual Acuity Review  Right Eye Distance:   Left Eye Distance:   Bilateral Distance:    Right Eye Near:   Left Eye Near:    Bilateral Near:         MDM   1. Visit for suture removal    Sutures removed. Instructions for care after given.    Hayden Rasmussen, NP 04/29/16 [phone]

## 2016-12-20 ENCOUNTER — Emergency Department (HOSPITAL_COMMUNITY): Payer: Self-pay

## 2016-12-20 ENCOUNTER — Encounter (HOSPITAL_COMMUNITY): Payer: Self-pay | Admitting: Emergency Medicine

## 2016-12-20 ENCOUNTER — Emergency Department (HOSPITAL_COMMUNITY)
Admission: EM | Admit: 2016-12-20 | Discharge: 2016-12-20 | Disposition: A | Discharge status: Home / Self Care | Payer: Self-pay | Attending: Emergency Medicine | Admitting: Emergency Medicine

## 2016-12-20 DIAGNOSIS — R059 Cough, unspecified: Secondary | ICD-10-CM

## 2016-12-20 DIAGNOSIS — J45901 Unspecified asthma with (acute) exacerbation: Secondary | ICD-10-CM | POA: Insufficient documentation

## 2016-12-20 DIAGNOSIS — Z79899 Other long term (current) drug therapy: Secondary | ICD-10-CM | POA: Insufficient documentation

## 2016-12-20 DIAGNOSIS — R05 Cough: Secondary | ICD-10-CM

## 2016-12-20 DIAGNOSIS — R000 Tachycardia, unspecified: Secondary | ICD-10-CM | POA: Insufficient documentation

## 2016-12-20 DIAGNOSIS — Z87891 Personal history of nicotine dependence: Secondary | ICD-10-CM | POA: Insufficient documentation

## 2016-12-20 LAB — COMPREHENSIVE METABOLIC PANEL
ALBUMIN: 3.8 g/dL (ref 3.5–5.0)
ALK PHOS: 40 U/L (ref 38–126)
ALT: 25 U/L (ref 14–54)
ANION GAP: 10 (ref 5–15)
AST: 31 U/L (ref 15–41)
BUN: 14 mg/dL (ref 6–20)
CALCIUM: 8.9 mg/dL (ref 8.9–10.3)
CO2: 25 mmol/L (ref 22–32)
Chloride: 106 mmol/L (ref 101–111)
Creatinine, Ser: 0.71 mg/dL (ref 0.44–1.00)
GFR calc Af Amer: >60 mL/min (ref >60)
GFR calc non Af Amer: >60 mL/min (ref >60)
GLUCOSE: 118 mg/dL — AB (ref 65–99)
POTASSIUM: 3.4 mmol/L — AB (ref 3.5–5.1)
SODIUM: 141 mmol/L (ref 135–145)
Total Bilirubin: 0.6 mg/dL (ref 0.3–1.2)
Total Protein: 6.9 g/dL (ref 6.5–8.1)

## 2016-12-20 LAB — CBC
HEMATOCRIT: 38.5 % (ref 36.0–46.0)
HEMOGLOBIN: 12.4 g/dL (ref 12.0–15.0)
MCH: 28.1 pg (ref 26.0–34.0)
MCHC: 32.2 g/dL (ref 30.0–36.0)
MCV: 87.3 fL (ref 78.0–100.0)
Platelets: 295 10*3/uL (ref 150–400)
RBC: 4.41 MIL/uL (ref 3.87–5.11)
RDW: 14.2 % (ref 11.5–15.5)
WBC: 12 10*3/uL — ABNORMAL HIGH (ref 4.0–10.5)

## 2016-12-20 MED ORDER — BENZONATATE 100 MG PO CAPS: 200.0000 mg | ORAL_CAPSULE | Freq: Two times a day (BID) | ORAL | 0 refills | Status: DC | PRN

## 2016-12-20 MED ORDER — PREDNISONE 20 MG PO TABS: 40.0000 mg | ORAL_TABLET | Freq: Every day | ORAL | 0 refills | Status: DC

## 2016-12-20 MED ORDER — GUAIFENESIN 100 MG/5ML PO SOLN
5.0000 mL | Freq: Once | ORAL | Status: AC
  Administered 2016-12-20: 100 mg via ORAL
  Filled 2016-12-20: qty 5

## 2016-12-20 MED ORDER — POTASSIUM CHLORIDE CRYS ER 20 MEQ PO TBCR
40.0000 meq | EXTENDED_RELEASE_TABLET | Freq: Once | ORAL | Status: AC
  Administered 2016-12-20: 40 meq via ORAL
  Filled 2016-12-20: qty 2

## 2016-12-20 MED ORDER — ALBUTEROL SULFATE HFA 108 (90 BASE) MCG/ACT IN AERS: 2.0000 | INHALATION_SPRAY | RESPIRATORY_TRACT | 3 refills | Status: DC | PRN

## 2016-12-20 MED ORDER — IPRATROPIUM BROMIDE 0.02 % IN SOLN
0.5000 mg | Freq: Once | RESPIRATORY_TRACT | Status: AC
  Administered 2016-12-20: 0.5 mg via RESPIRATORY_TRACT
  Filled 2016-12-20: qty 2.5

## 2016-12-20 MED ORDER — PREDNISONE 20 MG PO TABS
60.0000 mg | ORAL_TABLET | Freq: Once | ORAL | Status: AC
  Administered 2016-12-20: 60 mg via ORAL
  Filled 2016-12-20: qty 3

## 2016-12-20 MED ORDER — ALBUTEROL SULFATE (2.5 MG/3ML) 0.083% IN NEBU
5.0000 mg | INHALATION_SOLUTION | Freq: Once | RESPIRATORY_TRACT | Status: AC
  Administered 2016-12-20: 5 mg via RESPIRATORY_TRACT
  Filled 2016-12-20: qty 6

## 2016-12-20 NOTE — ED Provider Notes (Signed)
Hand-off from TXU CorpHannah Muthersbaugh, PA-C. Dispo pending reevaluation.   See initial provider's note for full HPI.  Briefly pt is a 21 yo F with PMH of asthma who presents to the ED with worsening cough over 3 weeks. Reports having URI at onset of sxs 3 weeks ago with has since resolved with exception of continued nonproductive cough. Denies fever. Denies, DVT, hemoptysis, immobilizations, leg swelling.  Physical Exam  BP 135/66   Pulse 93   Temp 98.3 F (36.8 C) (Oral)   Resp 20   LMP 11/22/2016 (Approximate)   SpO2 100%   Physical Exam  Constitutional: She is oriented to person, place, and time. She appears well-developed and well-nourished.  HENT:  Head: Normocephalic and atraumatic.  Eyes: Conjunctivae and EOM are normal. Right eye exhibits no discharge. Left eye exhibits no discharge. No scleral icterus.  Neck: Normal range of motion. Neck supple.  Cardiovascular: Normal rate.   Pulmonary/Chest: Effort normal and breath sounds normal. No respiratory distress. She has no wheezes. She has no rales. She exhibits no tenderness.  Abdominal: Soft. She exhibits no distension.  Musculoskeletal: Normal range of motion.  Neurological: She is alert and oriented to person, place, and time.  Skin: Skin is warm and dry.  Nursing note and vitals reviewed.   ED Course  Procedures  MDM Pt presents with asthma exacerbation with cough. Initial vitals showed HR 125. Exam performed by initial provider revealed expiratory wheezing throughout and decreased breath sounds. Pt given steroids, duoneb tx and robitussin. CXR negative. Labs showed mild leukocytosis. K 3.4, given PO potassium supplement in the ED. Suspect sxs are due to asthma exacerbation, pt remained afebrile. Repeat HR 115 s/p neb tx. Suspect pt's tachycardia is likely due to neb tx. Doubt pneumonia or DVT/PE. Plan to reevaluate pt and d/c home with steroids and inhaler.   7:40 AM- On my initial evaluation, pt is sitting resting  comfortably in no acute distress. HR 93, BP 122/80, RR 20, O2 sat 100% on RA. Pt reports improvement of sxs s/p neb. Exam unremarkable, lungs CTAB. Discussed results and plan for d/c home. Plan to d/c pt home with albuterol inhaler, antitussive and steroids. Advised to follow up with PCP. Discussed return precautions.        Satira Sarkicole Elizabeth RungeNadeau, [place] JerseyPA-C 12/20/16 [id number]    Alvira MondayErin Schlossman, MD 12/24/16 [phone]

## 2016-12-20 NOTE — ED Notes (Signed)
Pt to xray via stretcher

## 2016-12-20 NOTE — ED Notes (Signed)
Pt. Returned from xray via Doctor, general practicestretcher.

## 2016-12-20 NOTE — ED Triage Notes (Signed)
Pt reports a non-productive cough for over three weeks.  She reports that she has had 4 episodes of vomiting but feels that it is because she coughs so much.

## 2016-12-20 NOTE — ED Provider Notes (Signed)
[facility] Provider Note   CSN: [id number] Arrival date & time: 12/20/16  0413     History   Chief Complaint Chief Complaint  Patient presents with  . Cough  . Emesis    HPI Sarah Hatfield is a 21 y.o. female with a hx of Asthma presents to the Emergency Department complaining of gradual, persistent, progressively worsening cough onset 3 weeks ago.Patient reports that onset of symptoms she had a URI. She reports  since that time she's had worsening cough. She denies persistent URI symptoms including no sore throat, rhinorrhea, fevers, myalgias. She has no history of DVT, palpitations, hemoptysis, recent immobilization, leg swelling. She reports her cough seems different from previous asthma exacerbations but it's been several years since she's had one. Patient reports she's here to be evaluated today because her employer required it. She denies syncope, near syncope, shortness of breath, dyspnea on exertion. She's never been intubatedfor her asthma.     The history is provided by the patient and medical records. No language interpreter was used.    Past Medical History:  Diagnosis Date  . Asthma    rare use of inhaler, last time couple of yrs. ago  . Family history of adverse reaction to anesthesia    sister- wakes up during surgery    Patient Active Problem List   Diagnosis Date Noted  . Adjustment disorder with depressed mood 04/17/2016  . S/P lumbar laminectomy 10/23/2015    Past Surgical History:  Procedure Laterality Date  . LUMBAR LAMINECTOMY/DECOMPRESSION MICRODISCECTOMY Left 10/23/2015   Procedure: LUMBAR LAMINECTOMY/DECOMPRESSION MICRODISCECTOMY LUMBAR FOUR-FIVE;  Surgeon: Tia Alert, MD;  Location: [facility];  Service: Neurosurgery;  Laterality: Left;  . UPPER GI ENDOSCOPY     done as a child, told wnl    OB History    No data available       Home Medications    Prior to Admission medications   Medication Sig Start Date End Date  Taking? Authorizing Provider  ibuprofen (ADVIL,MOTRIN) 200 MG tablet Take 200 mg by mouth every 6 (six) hours as needed for fever or mild pain.   Yes Historical Provider, MD  albuterol (PROVENTIL HFA;VENTOLIN HFA) 108 (90 Base) MCG/ACT inhaler Inhale 2 puffs into the lungs every 4 (four) hours as needed for wheezing or shortness of breath. 12/20/16   Dahlia Client Francile Woolford, PA-C  HYDROcodone-acetaminophen (HYCET) 7.5-325 mg/15 ml solution Take 10 mLs by mouth every 6 (six) hours as needed for severe pain. Patient not taking: Reported on 12/20/2016 04/18/16   Trixie Dredge, PA-C  ibuprofen (ADVIL,MOTRIN) 800 MG tablet Take 1 tablet (800 mg total) by mouth every 8 (eight) hours as needed for mild pain or moderate pain. Patient not taking: Reported on 12/20/2016 04/18/16   Trixie Dredge, PA-C  predniSONE (DELTASONE) 20 MG tablet Take 2 tablets (40 mg total) by mouth daily. 12/20/16   Dahlia Client Samari Bittinger, PA-C    Family History Family History  Problem Relation Age of Onset  . Cancer Mother   . Asthma Mother   . Diabetes Other   . Hypertension Other     Social History Social History  Substance Use Topics  . Smoking status: Former Games developer  . Smokeless tobacco: Never Used  . Alcohol use Yes     Comment: social, occasionally      Allergies   Patient has no known allergies.   Review of Systems Review of Systems  Respiratory: Positive for cough, chest tightness and wheezing.   All other systems reviewed  and are negative.    Physical Exam Updated Vital Signs BP (!) 156/90 (BP Location: Right Arm)   Pulse (!) 125   Temp 98.3 F (36.8 C) (Oral)   Resp 20   LMP 11/22/2016 (Approximate)   SpO2 98%   Physical Exam  Constitutional: She appears well-developed and well-nourished. No distress.  Awake, alert, nontoxic appearance  HENT:  Head: Normocephalic and atraumatic.  Mouth/Throat: Oropharynx is clear and moist. No oropharyngeal exudate.  Eyes: Conjunctivae are normal. No scleral icterus.    Neck: Normal range of motion. Neck supple.  Cardiovascular: Regular rhythm and intact distal pulses.  Tachycardia present.   Pulses:      Radial pulses are 2+ on the right side, and 2+ on the left side.  Pulmonary/Chest: Effort normal. No respiratory distress. She has decreased breath sounds. She has wheezes ( Expiratory, throughout).  Equal chest expansion  Abdominal: Soft. Bowel sounds are normal. She exhibits no mass. There is no tenderness. There is no rebound and no guarding.  Musculoskeletal: Normal range of motion. She exhibits no edema.  Neurological: She is alert.  Speech is clear and goal oriented Moves extremities without ataxia  Skin: Skin is warm and dry. She is not diaphoretic.  Psychiatric: She has a normal mood and affect.  Nursing note and vitals reviewed.    ED Treatments / Results  Labs (all labs ordered are listed, but only abnormal results are displayed) Labs Reviewed  COMPREHENSIVE METABOLIC PANEL - Abnormal; Notable for the following:       Result Value   Potassium 3.4 (*)    Glucose, Bld 118 (*)    All other components within normal limits  CBC - Abnormal; Notable for the following:    WBC 12.0 (*)    All other components within normal limits     Radiology Dg Chest 2 View  Result Date: 12/20/2016 CLINICAL DATA:  Cough for 2 weeks. EXAM: CHEST  2 VIEW COMPARISON:  10/21/2015 FINDINGS: The cardiomediastinal contours are normal. The lungs are clear. Pulmonary vasculature is normal. No consolidation, pleural effusion, or pneumothorax. No acute osseous abnormalities are seen. IMPRESSION: No acute abnormality. Electronically Signed   By: Rubye Oaks M.D.   On: 12/20/2016 05:36    Procedures Procedures (including critical care time)  Medications Ordered in ED Medications  albuterol (PROVENTIL) (2.5 MG/3ML) 0.083% nebulizer solution 5 mg (5 mg Nebulization Given by Other 12/20/16 0629)  ipratropium (ATROVENT) nebulizer solution 0.5 mg (0.5 mg  Nebulization Given by Other 12/20/16 0629)  potassium chloride SA (K-DUR,KLOR-CON) CR tablet 40 mEq (40 mEq Oral Given by Other 12/20/16 1610)  predniSONE (DELTASONE) tablet 60 mg (60 mg Oral Given by Other 12/20/16 9604)  guaiFENesin (ROBITUSSIN) 100 MG/5ML solution 100 mg (100 mg Oral Given by Other 12/20/16 5409)     Initial Impression / Assessment and Plan / ED Course  I have reviewed the triage vital signs and the nursing notes.  Pertinent labs & imaging results that were available during my care of the patient were reviewed by me and considered in my medical decision making (see chart for details).  Clinical Course as of Dec 20 704  Mon Dec 20, 2016  0654 Patient is breathing better and coughing less.  [HM]    Clinical Course User Index [HM] Dahlia Client Jennalynn Rivard, PA-C    Pt presents with asthma exacerbation. She arrives tachycardic. On exam she has expiratory wheezing.  No chest pain or shortness of breath. Low risk for PE. Labs with mild  leukocytosis the patient is afebrile. Highly doubt pneumonia.  BP 135/66   Pulse (!) 105   Temp 98.3 F (36.8 C) (Oral)   Resp 20   LMP 11/22/2016 (Approximate)   SpO2 99%    7:03 AM At shift change care [facility] who will monitor HR and dispo when improved.  Pt is well appearing.    Final Clinical Impressions(s) / ED Diagnoses   Final diagnoses:  Cough  Exacerbation of asthma, unspecified asthma severity, unspecified whether persistent  Tachycardia    New Prescriptions New Prescriptions   ALBUTEROL (PROVENTIL HFA;VENTOLIN HFA) 108 (90 BASE) MCG/ACT INHALER    Inhale 2 puffs into the lungs every 4 (four) hours as needed for wheezing or shortness of breath.   PREDNISONE (DELTASONE) 20 MG TABLET    Take 2 tablets (40 mg total) by mouth daily.     Dahlia ClientHannah Tasheika Kitzmiller, PA-C 12/20/16 [id number]    Charlynne Panderavid Hsienta Yao, MD 12/23/16 [phone]

## 2016-12-20 NOTE — Discharge Instructions (Signed)
1. Medications: albuterol, prednisone, usual home medications °2. Treatment: rest, drink plenty of fluids, begin OTC antihistamine (Zyrtec or Claritin)  °3. Follow Up: Please followup with your primary doctor in 2-3 days for discussion of your diagnoses and further evaluation after today's visit; if you do not have a primary care doctor use the resource guide provided to find one; Please return to the ER for difficulty breathing, high fevers or worsening symptoms. ° °

## 2016-12-21 ENCOUNTER — Observation Stay (HOSPITAL_COMMUNITY): Payer: Self-pay

## 2016-12-21 ENCOUNTER — Observation Stay (HOSPITAL_COMMUNITY)
Admission: EM | Admit: 2016-12-21 | Discharge: 2016-12-22 | Disposition: A | Discharge status: Home / Self Care | Payer: Self-pay | Attending: Family Medicine | Admitting: Family Medicine

## 2016-12-21 ENCOUNTER — Encounter (HOSPITAL_COMMUNITY): Payer: Self-pay

## 2016-12-21 DIAGNOSIS — R062 Wheezing: Secondary | ICD-10-CM

## 2016-12-21 DIAGNOSIS — J45902 Unspecified asthma with status asthmaticus: Secondary | ICD-10-CM

## 2016-12-21 DIAGNOSIS — J45901 Unspecified asthma with (acute) exacerbation: Principal | ICD-10-CM | POA: Diagnosis present

## 2016-12-21 DIAGNOSIS — R000 Tachycardia, unspecified: Secondary | ICD-10-CM

## 2016-12-21 DIAGNOSIS — Z87891 Personal history of nicotine dependence: Secondary | ICD-10-CM | POA: Insufficient documentation

## 2016-12-21 DIAGNOSIS — R0602 Shortness of breath: Secondary | ICD-10-CM

## 2016-12-21 DIAGNOSIS — J4521 Mild intermittent asthma with (acute) exacerbation: Secondary | ICD-10-CM

## 2016-12-21 LAB — CBC WITH DIFFERENTIAL/PLATELET
BASOS PCT: 0 %
Basophils Absolute: 0 10*3/uL (ref 0.0–0.1)
EOS ABS: 0.2 10*3/uL (ref 0.0–0.7)
EOS PCT: 2 %
HCT: 38.5 % (ref 36.0–46.0)
Hemoglobin: 12.5 g/dL (ref 12.0–15.0)
LYMPHS ABS: 2.6 10*3/uL (ref 0.7–4.0)
Lymphocytes Relative: 19 %
MCH: 28.2 pg (ref 26.0–34.0)
MCHC: 32.5 g/dL (ref 30.0–36.0)
MCV: 86.9 fL (ref 78.0–100.0)
MONO ABS: 0.7 10*3/uL (ref 0.1–1.0)
MONOS PCT: 5 %
NEUTROS PCT: 74 %
Neutro Abs: 10.3 10*3/uL — ABNORMAL HIGH (ref 1.7–7.7)
PLATELETS: 256 10*3/uL (ref 150–400)
RBC: 4.43 MIL/uL (ref 3.87–5.11)
RDW: 14.7 % (ref 11.5–15.5)
WBC: 13.8 10*3/uL — ABNORMAL HIGH (ref 4.0–10.5)

## 2016-12-21 LAB — BASIC METABOLIC PANEL
Anion gap: 5 (ref 5–15)
BUN: 10 mg/dL (ref 6–20)
CALCIUM: 8.8 mg/dL — AB (ref 8.9–10.3)
CHLORIDE: 110 mmol/L (ref 101–111)
CO2: 26 mmol/L (ref 22–32)
CREATININE: 0.74 mg/dL (ref 0.44–1.00)
GFR calc non Af Amer: >60 mL/min (ref >60)
GLUCOSE: 124 mg/dL — AB (ref 65–99)
Potassium: 3.4 mmol/L — ABNORMAL LOW (ref 3.5–5.1)
Sodium: 141 mmol/L (ref 135–145)

## 2016-12-21 LAB — I-STAT BETA HCG BLOOD, ED (MC, WL, AP ONLY): I-stat hCG, quantitative: <5 m[IU]/mL (ref <5)

## 2016-12-21 MED ORDER — MAGNESIUM SULFATE 2 GM/50ML IV SOLN
2.0000 g | Freq: Once | INTRAVENOUS | Status: AC
  Administered 2016-12-21: 2 g via INTRAVENOUS
  Filled 2016-12-21: qty 50

## 2016-12-21 MED ORDER — ALBUTEROL (5 MG/ML) CONTINUOUS INHALATION SOLN
10.0000 mg/h | INHALATION_SOLUTION | RESPIRATORY_TRACT | Status: DC
  Administered 2016-12-21: 10 mg/h via RESPIRATORY_TRACT

## 2016-12-21 MED ORDER — METHYLPREDNISOLONE SODIUM SUCC 125 MG IJ SOLR
100.0000 mg | Freq: Once | INTRAMUSCULAR | Status: AC
  Administered 2016-12-21: 100 mg via INTRAVENOUS
  Filled 2016-12-21: qty 2

## 2016-12-21 MED ORDER — BUDESONIDE 0.5 MG/2ML IN SUSP
0.5000 mg | Freq: Two times a day (BID) | RESPIRATORY_TRACT | Status: DC
  Administered 2016-12-21 – 2016-12-22 (×2): 0.5 mg via RESPIRATORY_TRACT
  Filled 2016-12-21 (×2): qty 2

## 2016-12-21 MED ORDER — ONDANSETRON HCL 4 MG/2ML IJ SOLN: 4.0000 mg | Freq: Four times a day (QID) | INTRAMUSCULAR | Status: DC | PRN

## 2016-12-21 MED ORDER — METHYLPREDNISOLONE SODIUM SUCC 125 MG IJ SOLR: 60.0000 mg | Freq: Two times a day (BID) | INTRAMUSCULAR | Status: DC

## 2016-12-21 MED ORDER — ALBUTEROL (5 MG/ML) CONTINUOUS INHALATION SOLN
10.0000 mg/h | INHALATION_SOLUTION | RESPIRATORY_TRACT | Status: DC
  Administered 2016-12-21: 10 mg/h via RESPIRATORY_TRACT
  Filled 2016-12-21: qty 20

## 2016-12-21 MED ORDER — ALBUTEROL SULFATE (2.5 MG/3ML) 0.083% IN NEBU
2.5000 mg | INHALATION_SOLUTION | RESPIRATORY_TRACT | Status: DC | PRN
  Administered 2016-12-21: 2.5 mg via RESPIRATORY_TRACT
  Filled 2016-12-21: qty 3

## 2016-12-21 MED ORDER — SODIUM CHLORIDE 0.9 % IV BOLUS (SEPSIS)
1000.0000 mL | Freq: Once | INTRAVENOUS | Status: AC
  Administered 2016-12-21: 1000 mL via INTRAVENOUS

## 2016-12-21 MED ORDER — MAGNESIUM SULFATE 50 % IJ SOLN: 2.0000 g | Freq: Once | INTRAMUSCULAR | Status: DC

## 2016-12-21 MED ORDER — IPRATROPIUM-ALBUTEROL 0.5-2.5 (3) MG/3ML IN SOLN
3.0000 mL | RESPIRATORY_TRACT | Status: DC
  Administered 2016-12-21 – 2016-12-22 (×4): 3 mL via RESPIRATORY_TRACT
  Filled 2016-12-21 (×5): qty 3

## 2016-12-21 MED ORDER — DM-GUAIFENESIN ER 30-600 MG PO TB12
1.0000 | ORAL_TABLET | Freq: Two times a day (BID) | ORAL | Status: DC
  Administered 2016-12-21 – 2016-12-22 (×2): 1 via ORAL
  Filled 2016-12-21 (×2): qty 1

## 2016-12-21 MED ORDER — METHYLPREDNISOLONE SODIUM SUCC 125 MG IJ SOLR
125.0000 mg | Freq: Once | INTRAMUSCULAR | Status: AC
  Administered 2016-12-21: 125 mg via INTRAVENOUS
  Filled 2016-12-21: qty 2

## 2016-12-21 MED ORDER — ACETAMINOPHEN 650 MG RE SUPP: 650.0000 mg | Freq: Four times a day (QID) | RECTAL | Status: DC | PRN

## 2016-12-21 MED ORDER — ACETAMINOPHEN 325 MG PO TABS: 650.0000 mg | ORAL_TABLET | Freq: Four times a day (QID) | ORAL | Status: DC | PRN

## 2016-12-21 MED ORDER — ONDANSETRON HCL 4 MG PO TABS: 4.0000 mg | ORAL_TABLET | Freq: Four times a day (QID) | ORAL | Status: DC | PRN

## 2016-12-21 NOTE — Progress Notes (Signed)
Pt c/o SOB. Assessment noted exp wheezing and resp rate of 26. O2 sat 100% on RA. Pt requesting Neb treatment per Resp Tx. Called Respt Tx and reported pt request. Resp Tx stated that based on previous treatment, pt can receive another Neb after 2117.  Pt notified of time she could receive Neb.  Pt verbalized understanding and was ok with POC.  Will cont to monitor pt status.

## 2016-12-21 NOTE — Progress Notes (Signed)
Pt cont to c/o SOB. Resp rate 25-30. Cont to note insp and exp wheezes on ascultation. Resp TX at bedside at this time for another q2hr prn treatment.  Concern re: pt needing higher level of care.  Discusses with Resp Tx.  On call paged.  Will cont to monitor.

## 2016-12-21 NOTE — H&P (Signed)
History and Physical    Shital Javid ONG:[id number] DOB: 22-Sep-1996  DOA: 12/21/2016 PCP: [facility]  Patient coming from: Home  Chief Complaint: Home  HPI: Sarah Hatfield is a 21 y.o. female with medical history significant of asthma. She presented to the emergency department complaining of wheezing and chest tightness. Patient was seen from [facility] yesterday for shortness of breath and chest pain. She was discharge with prednisone and inhalers. No she come back with no significant improvement of symptoms she continues with cough and dyspnea.  Patient reported that she was on her usual health state until about 3 weeks ago when she developed an URI. Since then she's has been having difficulty with breathing until 2 days ago when she was severely short of breath and wheezing.  ED Course:  patient was treated with albuterol 1 hour nebulizer treatment, Solu-Medrol 125, magnesium, with no significant improvement. TRH was asked to admit  Review of Systems:   General: no changes in body weight, no fever chills or decrease in energy.  HEENT: no blurry vision, hearing changes or sore throat Respiratory: See HPI  CV: no chest pain, no palpitations GI: no nausea, vomiting, abdominal pain, diarrhea, constipation GU: no dysuria, burning on urination, increased urinary frequency, hematuria  Ext:. No deformities,  Neuro: no unilateral weakness, numbness, or tingling, no vision change or hearing loss Skin: No rashes, lesions or wounds. MSK: No muscle spasm, no deformity, no limitation of range of movement in spin Heme: No easy bruising.  Travel history: No recent long distant travel.   Past Medical History:  Diagnosis Date  . Asthma    rare use of inhaler, last time couple of yrs. ago  . Family history of adverse reaction to anesthesia    sister- wakes up during surgery    Past Surgical History:  Procedure Laterality Date  . LUMBAR LAMINECTOMY/DECOMPRESSION  MICRODISCECTOMY Left 10/23/2015   Procedure: LUMBAR LAMINECTOMY/DECOMPRESSION MICRODISCECTOMY LUMBAR FOUR-FIVE;  Surgeon: Tia Alertavid S Jones, MD;  Location: [facility];  Service: Neurosurgery;  Laterality: Left;  . UPPER GI ENDOSCOPY     done as a child, told wnl     reports that she has quit smoking. She has never used smokeless tobacco. She reports that she drinks alcohol. She reports that she does not use drugs.  No Known Allergies  Family History  Problem Relation Age of Onset  . Cancer Mother   . Asthma Mother   . Diabetes Other   . Hypertension Other     Prior to Admission medications   Medication Sig Start Date End Date Taking? Authorizing Provider  albuterol (PROVENTIL HFA;VENTOLIN HFA) 108 (90 Base) MCG/ACT inhaler Inhale 2 puffs into the lungs every 4 (four) hours as needed for wheezing or shortness of breath. 12/20/16  Yes Hannah Muthersbaugh, PA-C  benzonatate (TESSALON) 100 MG capsule Take 2 capsules (200 mg total) by mouth 2 (two) times daily as needed for cough. 12/20/16  Yes Barrett HenleNicole Elizabeth Nadeau, PA-C  ibuprofen (ADVIL,MOTRIN) 200 MG tablet Take 400 mg by mouth every 6 (six) hours as needed for fever, headache, mild pain, moderate pain or cramping.    Yes Historical Provider, MD  predniSONE (DELTASONE) 20 MG tablet Take 2 tablets (40 mg total) by mouth daily. Patient taking differently: Take 40 mg by mouth daily. Started 03/26 for 5 days 12/20/16  Yes Dahlia ClientHannah Muthersbaugh, PA-C    Physical Exam: Vitals:   12/21/16 1555 12/21/16 1600 12/21/16 1615 12/21/16 1630  BP:  139/64  136/86  Pulse:  (!) 118 (!) 115 (!) 113  Resp:  (!) 28  (!) 26  Temp:      TempSrc:      SpO2: 99% 99% 97% 98%  Weight:      Height:         Constitutional: Mild distress Eyes: PERRL, lids and conjunctivae normal ENMT: Mucous membranes are moist. Posterior pharynx clear of any exudate or lesions.  Neck: normal, supple, no masses, no thyromegaly Respiratory: Air entry diminish b/l, diffuse  exp wheezing during entire phase, difficulty speak in full sentences, no use of accessory muscles,    Cardiovascular: S1S2 Tachy @ 120  Abdomen: Obese no tenderness, no masses palpated. No hepatosplenomegaly. Bowel sounds positive.  Musculoskeletal: no clubbing / cyanosis. No joint deformity upper and lower extremities.  Skin: no rashes, lesions, ulcers. No induration Neurologic: CN 2-12 grossly intact.   Psychiatric: Normal judgment and insight. Alert and oriented x 3. Normal mood.    Labs on Admission: I have personally reviewed following labs and imaging studies  CBC:  Recent Labs Lab 12/20/16 0425 12/21/16 1434  WBC 12.0* 13.8*  NEUTROABS  --  10.3*  HGB 12.4 12.5  HCT 38.5 38.5  MCV 87.3 86.9  PLT 295 256   Basic Metabolic Panel:  Recent Labs Lab 12/20/16 0425 12/21/16 1434  NA 141 141  K 3.4* 3.4*  CL 106 110  CO2 25 26  GLUCOSE 118* 124*  BUN 14 10  CREATININE 0.71 0.74  CALCIUM 8.9 8.8*   GFR: Estimated Creatinine Clearance: 188.6 mL/min (by C-G formula based on SCr of 0.74 mg/dL). Liver Function Tests:  Recent Labs Lab 12/20/16 0425  AST 31  ALT 25  ALKPHOS 40  BILITOT 0.6  PROT 6.9  ALBUMIN 3.8    Radiological Exams on Admission: Dg Chest 2 View  Result Date: 12/20/2016 CLINICAL DATA:  Cough for 2 weeks. EXAM: CHEST  2 VIEW COMPARISON:  10/21/2015 FINDINGS: The cardiomediastinal contours are normal. The lungs are clear. Pulmonary vasculature is normal. No consolidation, pleural effusion, or pneumothorax. No acute osseous abnormalities are seen. IMPRESSION: No acute abnormality. Electronically Signed   By: Rubye Oaks M.D.   On: 12/20/2016 05:36    EKG: Ordered  Assessment/Plan Asthma exacerbation - failed outpatient therapy with prednisone, and albuterol  Patient was using neb treatment at home every hour w/o improvement - Treated in the ED with 1hr CAT, Mag and Solumedrol with no sig improvement. Patient was kept on CAT for about  2  hours, mild improvement, still chest tightness and diffuse wheezing  Admit to medsurg Duoneb q 4hrs,  Albuterol 2 hr PRN Pulmicort BID  Solumedrol 60 BID   O2 PRN  Check O2 sat with ambulation  Check CXR   Tachycardia - due to continues albuterol use  Will monitor with cardiac monitor   Morbid obese BMI 47.6 Weight loss advised   DVT prophylaxis: Early ambulation  Code Status: Full  Family Communication: None at bedside  Disposition Plan: Anticipate discharge to previous home environment.  Consults called: None  Admission status: Medsurg/Obs   Latrelle Dodrill MD [facility] Pager: Text Page via [email or web address]  [phone]  If 7PM-7AM, please contact night-coverage [email or web address] Password [email or web address]  12/21/2016, 5:02 PM

## 2016-12-21 NOTE — ED Notes (Signed)
Patient transported to X-ray 

## 2016-12-21 NOTE — Progress Notes (Signed)
Neb given early due to dyspnea, which worsened with ambulation to the bathroom. HR 135, down to 119 post neb. Wheezing continues. Patient states she feels somewhat better than before.

## 2016-12-21 NOTE — ED Notes (Signed)
Respiratory notified of continuous nebulization. 

## 2016-12-21 NOTE — ED Triage Notes (Signed)
Patient reports that she was seen at [facility] ED for the same- Productive cough with clear sputum, SOB, and expiratory wheezing. Patient states she has been using an albuterol inhaler, cough, medicine and Albuterol nebulizer and continues to have cough and SOB Patient states she haas been using the nebulizer hourly.

## 2016-12-21 NOTE — ED Notes (Signed)
Hospitalist at bedside 

## 2016-12-21 NOTE — ED Provider Notes (Signed)
[facility]-EMERGENCY DEPT Provider Note   CSN: [id number] Arrival date & time: 12/21/16  1231     History   Chief Complaint Chief Complaint  Patient presents with  . Cough  . Shortness of Breath    HPI Sarah Hatfield is a 21 y.o. female.  Patient presents with no improvement of wheezing, cough and dyspnea since being d/c from the ED yesterday. She was sent home with albuterol inhaler and neb and prednisone taper. She has been having to use the neb every hour since being home. Still feels like she is wheezing. She is having some dyspnea with even walking to the bathroom. Also reports her dry cough that has not gotten better with cough medicine. Says her abdominal pain and headache only occur when she coughs. The last time she had an asthma exacerbation was when she was 21yo. Never been intubated. At baseline, she only has to use inhaler a couple times a month. Denies fever, chest pain, palpitations, leg swelling, OCP use, smoking, bowel changes, urinary symptoms.      Past Medical History:  Diagnosis Date  . Asthma    rare use of inhaler, last time couple of yrs. ago  . Family history of adverse reaction to anesthesia    sister- wakes up during surgery    Patient Active Problem List   Diagnosis Date Noted  . Asthma exacerbation 12/21/2016  . Adjustment disorder with depressed mood 04/17/2016  . S/P lumbar laminectomy 10/23/2015    Past Surgical History:  Procedure Laterality Date  . LUMBAR LAMINECTOMY/DECOMPRESSION MICRODISCECTOMY Left 10/23/2015   Procedure: LUMBAR LAMINECTOMY/DECOMPRESSION MICRODISCECTOMY LUMBAR FOUR-FIVE;  Surgeon: Tia Alertavid S Jones, MD;  Location: [facility];  Service: Neurosurgery;  Laterality: Left;  . UPPER GI ENDOSCOPY     done as a child, told wnl    OB History    No data available       Home Medications    Prior to Admission medications   Medication Sig Start Date End Date Taking? Authorizing Provider  albuterol (PROVENTIL HFA;VENTOLIN  HFA) 108 (90 Base) MCG/ACT inhaler Inhale 2 puffs into the lungs every 4 (four) hours as needed for wheezing or shortness of breath. 12/20/16  Yes Hannah Muthersbaugh, PA-C  benzonatate (TESSALON) 100 MG capsule Take 2 capsules (200 mg total) by mouth 2 (two) times daily as needed for cough. 12/20/16  Yes Barrett HenleNicole Elizabeth Nadeau, PA-C  ibuprofen (ADVIL,MOTRIN) 200 MG tablet Take 400 mg by mouth every 6 (six) hours as needed for fever, headache, mild pain, moderate pain or cramping.    Yes Historical Provider, MD  predniSONE (DELTASONE) 20 MG tablet Take 2 tablets (40 mg total) by mouth daily. Patient taking differently: Take 40 mg by mouth daily. Started 03/26 for 5 days 12/20/16  Yes Dahlia ClientHannah Muthersbaugh, PA-C    Family History Family History  Problem Relation Age of Onset  . Cancer Mother   . Asthma Mother   . Diabetes Other   . Hypertension Other     Social History Social History  Substance Use Topics  . Smoking status: Former Games developermoker  . Smokeless tobacco: Never Used  . Alcohol use Yes     Comment: social, occasionally      Allergies   Patient has no known allergies.   Review of Systems Review of Systems  Constitutional: Negative for appetite change, chills and fever.  HENT: Negative for ear pain, rhinorrhea, sneezing and sore throat.   Eyes: Negative for visual disturbance.  Respiratory: Positive for cough, chest tightness  and shortness of breath. Negative for wheezing.   Cardiovascular: Negative for chest pain, palpitations and leg swelling.  Gastrointestinal: Positive for abdominal pain. Negative for blood in stool, constipation, diarrhea, nausea and vomiting.  Endocrine: Negative for polyuria.  Genitourinary: Negative for dysuria.  Musculoskeletal: Negative for myalgias.  Skin: Negative for rash.  Neurological: Negative for dizziness, weakness and light-headedness.     Physical Exam Updated Vital Signs BP 119/83 (BP Location: Right Arm)   Pulse (!) 125   Temp 98 F  (36.7 C) (Oral)   Resp (!) 33   Ht 6' (1.829 m)   Wt (!) 158.8 kg   LMP 11/22/2016 (Approximate)   SpO2 96%   BMI 47.47 kg/m   Physical Exam  Constitutional: She appears well-developed and well-nourished. No distress.  HENT:  Head: Normocephalic and atraumatic.  Nose: Nose normal.  Eyes: Conjunctivae and EOM are normal. Right eye exhibits no discharge. Left eye exhibits no discharge. No scleral icterus.  Neck: Normal range of motion. Neck supple.  Cardiovascular: Regular rhythm, normal heart sounds and intact distal pulses.  Tachycardia present.  Exam reveals no gallop and no friction rub.   No murmur heard. Pulmonary/Chest: Accessory muscle usage present. Tachypnea noted. She has decreased breath sounds in the right lower field and the left lower field. She has wheezes.  Abdominal: Soft. Bowel sounds are normal. She exhibits no distension. There is no tenderness. There is no guarding.  Musculoskeletal: Normal range of motion. She exhibits no edema, tenderness or deformity.  No leg swelling.  Neurological: She is alert. She exhibits normal muscle tone. Coordination normal.  Skin: Skin is warm and dry. No rash noted.  Psychiatric: She has a normal mood and affect.  Nursing note and vitals reviewed.    ED Treatments / Results  Labs (all labs ordered are listed, but only abnormal results are displayed) Labs Reviewed  BASIC METABOLIC PANEL - Abnormal; Notable for the following:       Result Value   Potassium 3.4 (*)    Glucose, Bld 124 (*)    Calcium 8.8 (*)    All other components within normal limits  CBC WITH DIFFERENTIAL/PLATELET - Abnormal; Notable for the following:    WBC 13.8 (*)    Neutro Abs 10.3 (*)    All other components within normal limits  HIV ANTIBODY (ROUTINE TESTING)  I-STAT BETA HCG BLOOD, ED ([facility], [facility], [facility] ONLY)    EKG  EKG Interpretation None       Radiology X-ray Chest Pa And Lateral  Result Date: 12/21/2016 CLINICAL DATA:  Dyspnea EXAM: CHEST   2 VIEW COMPARISON:  12/20/2016 chest radiograph. FINDINGS: Stable cardiomediastinal silhouette with normal heart size. No pneumothorax. No pleural effusion. Lungs appear clear, with no acute consolidative airspace disease and no pulmonary edema. IMPRESSION: No active cardiopulmonary disease. Electronically Signed   By: Delbert Phenix M.D.   On: 12/21/2016 17:42   Dg Chest 2 View  Result Date: 12/20/2016 CLINICAL DATA:  Cough for 2 weeks. EXAM: CHEST  2 VIEW COMPARISON:  10/21/2015 FINDINGS: The cardiomediastinal contours are normal. The lungs are clear. Pulmonary vasculature is normal. No consolidation, pleural effusion, or pneumothorax. No acute osseous abnormalities are seen. IMPRESSION: No acute abnormality. Electronically Signed   By: Rubye Oaks M.D.   On: 12/20/2016 05:36    Procedures Procedures (including critical care time) CRITICAL CARE Performed by: Dietrich Pates   Total critical care time: 30 minutes  Critical care time was exclusive of separately billable procedures  and treating other patients.  Critical care was necessary to treat or prevent imminent or life-threatening deterioration.  Critical care was time spent personally by me on the following activities: development of treatment plan with patient and/or surrogate as well as nursing, discussions with consultants, evaluation of patient's response to treatment, examination of patient, obtaining history from patient or surrogate, ordering and performing treatments and interventions, ordering and review of laboratory studies, ordering and review of radiographic studies, pulse oximetry and re-evaluation of patient's condition.   Medications Ordered in ED Medications  ipratropium-albuterol (DUONEB) 0.5-2.5 (3) MG/3ML nebulizer solution 3 mL (3 mLs Nebulization Not Given 12/21/16 1725)  albuterol (PROVENTIL) (2.5 MG/3ML) 0.083% nebulizer solution 2.5 mg (not administered)  methylPREDNISolone sodium succinate (SOLU-MEDROL) 125  mg/2 mL injection 60 mg (not administered)  budesonide (PULMICORT) nebulizer solution 0.5 mg (not administered)  dextromethorphan-guaiFENesin (MUCINEX DM) 30-600 MG per 12 hr tablet 1 tablet (not administered)  acetaminophen (TYLENOL) tablet 650 mg (not administered)    Or  acetaminophen (TYLENOL) suppository 650 mg (not administered)  ondansetron (ZOFRAN) tablet 4 mg (not administered)    Or  ondansetron (ZOFRAN) injection 4 mg (not administered)  methylPREDNISolone sodium succinate (SOLU-MEDROL) 125 mg/2 mL injection 125 mg (125 mg Intravenous Given 12/21/16 1328)  sodium chloride 0.9 % bolus 1,000 mL (0 mLs Intravenous Stopped 12/21/16 1547)  magnesium sulfate IVPB 2 g 50 mL (0 g Intravenous Stopped 12/21/16 1438)     Initial Impression / Assessment and Plan / ED Course  I have reviewed the triage vital signs and the nursing notes.  Pertinent labs & imaging results that were available during my care of the patient were reviewed by me and considered in my medical decision making (see chart for details).     Patient's course is concerning for asthma exacerbation. Low likelihood of PE considering no recent prolonged travel, no leg swelling, no fhx or pmhx of blood clot, no OCP use. Patient presents with wheezing. Due to failing outpatient regimen, began her on continuous neb, Solu-Medrol and mag sulfate. Repeat CBC, CMP, hcg were done. Labs were unremarkable except for leukocytosis at 13.8. After 1 hour of continuous neb, she felt no better. Still wheezing bilaterally, with diminished breath sounds. Continued for another 4 hours. Patient will need to be admitted for further treatment. She was educated on why this was necessary for improvement of her symptoms and to prevent getting any worse. Patient agreed to plan.  Patient was discussed with and seen by Dr. Clarene Duke.   Final Clinical Impressions(s) / ED Diagnoses   Final diagnoses:  Moderate asthma with status asthmaticus, unspecified  whether persistent    New Prescriptions New Prescriptions   No medications on file     Dietrich Pates, [place] 12/21/16 1902    Laurence Spates, MD 12/21/16 [phone]

## 2016-12-21 NOTE — ED Notes (Signed)
Respiratory notified of nebulization. 

## 2016-12-22 ENCOUNTER — Encounter: Payer: Self-pay | Admitting: Family Medicine

## 2016-12-22 ENCOUNTER — Observation Stay (HOSPITAL_COMMUNITY): Payer: Self-pay

## 2016-12-22 DIAGNOSIS — J45902 Unspecified asthma with status asthmaticus: Secondary | ICD-10-CM

## 2016-12-22 LAB — GLUCOSE, CAPILLARY: GLUCOSE-CAPILLARY: 207 mg/dL — AB (ref 65–99)

## 2016-12-22 LAB — HIV ANTIBODY (ROUTINE TESTING W REFLEX): HIV Screen 4th Generation wRfx: NONREACTIVE

## 2016-12-22 MED ORDER — ALBUTEROL SULFATE HFA 108 (90 BASE) MCG/ACT IN AERS: 2.0000 | INHALATION_SPRAY | RESPIRATORY_TRACT | 3 refills | Status: DC | PRN

## 2016-12-22 MED ORDER — PREDNISONE 20 MG PO TABS: 60.0000 mg | ORAL_TABLET | Freq: Every day | ORAL | 0 refills | Status: DC

## 2016-12-22 MED ORDER — BENZONATATE 100 MG PO CAPS: 200.0000 mg | ORAL_CAPSULE | Freq: Two times a day (BID) | ORAL | 0 refills | Status: DC | PRN

## 2016-12-22 MED ORDER — METHYLPREDNISOLONE SODIUM SUCC 125 MG IJ SOLR
60.0000 mg | Freq: Four times a day (QID) | INTRAMUSCULAR | Status: DC
  Administered 2016-12-22: 60 mg via INTRAVENOUS
  Filled 2016-12-22: qty 2

## 2016-12-22 MED ORDER — FLUTICASONE-SALMETEROL 250-50 MCG/DOSE IN AEPB: 1.0000 | INHALATION_SPRAY | Freq: Two times a day (BID) | RESPIRATORY_TRACT | 1 refills | Status: DC

## 2016-12-22 MED ORDER — BUDESONIDE 180 MCG/ACT IN AEPB: 2.0000 | INHALATION_SPRAY | Freq: Two times a day (BID) | RESPIRATORY_TRACT | 3 refills | Status: DC

## 2016-12-22 MED ORDER — PREDNISONE 20 MG PO TABS: 60.0000 mg | ORAL_TABLET | Freq: Every day | ORAL | Status: DC

## 2016-12-22 MED ORDER — MONTELUKAST SODIUM 10 MG PO TABS: 10.0000 mg | ORAL_TABLET | Freq: Every day | ORAL | 0 refills | Status: DC

## 2016-12-22 MED FILL — PULMICORT 180 MCG FLEXHALER: 180 | 30 days supply | Qty: 1 | Fill #0

## 2016-12-22 MED FILL — predniSONE 20 MG TABS: 20 | 10 days supply | Qty: 30 | Fill #0

## 2016-12-22 MED FILL — VENTOLIN HFA 90 MCG INHALER: 108 (90 BAS | 25 days supply | Qty: 18 | Fill #0

## 2016-12-22 MED FILL — BENZONATATE 100 MG CAP: 100 | 5 days supply | Qty: 20 | Fill #0

## 2016-12-22 MED FILL — MONTELUKAST SOD 10 MG TAB: 10 | 30 days supply | Qty: 30 | Fill #0

## 2016-12-22 NOTE — Discharge Summary (Addendum)
Physician Discharge Summary  Sarah Hatfield WUJ:[id number] DOB: 07/11/1996 DOA: 12/21/2016  PCP: [facility]  Admit date: 12/21/2016 Discharge date: 12/22/2016  Time spent: 30 minutes  Recommendations for Outpatient Follow-up:  1. Patient given Prednisone Rx for 10 days as a burst 2. PAtient given Albuterol refill, started Advair and Montelukast this admit 3. Patient given return to work note on 3/30  Discharge Diagnoses:  Active Problems:   Asthma exacerbation   Discharge Condition: fair  Diet recommendation: improved  Filed Weights   12/21/16 1237  Weight: (!) 158.8 kg (350 lb)    History of present illness:  21 y.o. female with medical history significant of asthma. She presented to the emergency department complaining of wheezing and chest tightness. Patient was seen from [facility] ED 3/26 for shortness of breath and chest pain. She was discharge with prednisone and inhalers She claims to have "[place] the [place]er [place] the mall int he garbage" accidentally Given 1 hour long neb in ED and Magnesium  Nebbed overnight and given Solu-medrol Seemed insistent to go home but lungs were clear Given Repeat Rx for Albuterol Added Advair, Prednisone burst for 10 days and advised to fill Advair and Montelukast if able to afford Also given work excuse till 3/30    Discharge Exam: Vitals:   12/22/16 0532 12/22/16 0607  BP: 130/63   Pulse: (!) 110 (!) 117  Resp: (!) 22 20  Temp: 98 F (36.7 C)     General: eomi ncat Cardiovascular: s1 s 2no m/r/g Respiratory: slight wheeze Patient states feeling better but mainly SOB with ambulation Able to talk full sentences  Discharge Instructions   Discharge Instructions    Diet - low sodium heart healthy    Complete by:  As directed    Discharge instructions    Complete by:  As directed    Take 60 mg of oral prednisone for the next 10 days-you may use your home prednisone tablets and complete this prescritpion prior to  filling the new prescription Use Albuterol inhaler 4-6 x a day for Shortness of breath I have prescribed Advair as well which should be used 2 x a day to control your symptoms in addition to Montelukast which will control your symtpoms Please complete all of the prednisone   Increase activity slowly    Complete by:  As directed      Current Discharge Medication List    START taking these medications   Details  budesonide (PULMICORT) 180 MCG/ACT inhaler Inhale 2 puffs into the lungs 2 (two) times daily. Qty: 1 Inhaler, Refills: 3    montelukast (SINGULAIR) 10 MG tablet Take 1 tablet (10 mg total) by mouth at bedtime. Qty: 30 tablet, Refills: 0      CONTINUE these medications which have CHANGED   Details  albuterol (PROVENTIL HFA;VENTOLIN HFA) 108 (90 Base) MCG/ACT inhaler Inhale 2 puffs into the lungs every 4 (four) hours as needed for wheezing or shortness of breath. Qty: 1 Inhaler, Refills: 3    benzonatate (TESSALON) 100 MG capsule Take 2 capsules (200 mg total) by mouth 2 (two) times daily as needed for cough. Qty: 20 capsule, Refills: 0    predniSONE (DELTASONE) 20 MG tablet Take 3 tablets (60 mg total) by mouth daily before breakfast. Qty: 30 tablet, Refills: 0      CONTINUE these medications which have NOT CHANGED   Details  ibuprofen (ADVIL,MOTRIN) 200 MG tablet Take 400 mg by mouth every 6 (six) hours as needed for fever,  headache, mild pain, moderate pain or cramping.        No Known Allergies    The results of significant diagnostics from this hospitalization (including imaging, microbiology, ancillary and laboratory) are listed below for reference.    Significant Diagnostic Studies: X-ray Chest Pa And Lateral  Result Date: 12/21/2016 CLINICAL DATA:  Dyspnea EXAM: CHEST  2 VIEW COMPARISON:  12/20/2016 chest radiograph. FINDINGS: Stable cardiomediastinal silhouette with normal heart size. No pneumothorax. No pleural effusion. Lungs appear clear, with no acute  consolidative airspace disease and no pulmonary edema. IMPRESSION: No active cardiopulmonary disease. Electronically Signed   By: Delbert PhenixJason A Poff M.D.   On: 12/21/2016 17:42   Dg Chest 2 View  Result Date: 12/20/2016 CLINICAL DATA:  Cough for 2 weeks. EXAM: CHEST  2 VIEW COMPARISON:  10/21/2015 FINDINGS: The cardiomediastinal contours are normal. The lungs are clear. Pulmonary vasculature is normal. No consolidation, pleural effusion, or pneumothorax. No acute osseous abnormalities are seen. IMPRESSION: No acute abnormality. Electronically Signed   By: Rubye OaksMelanie  Ehinger M.D.   On: 12/20/2016 05:36   Dg Chest Port 1 View  Result Date: 12/22/2016 CLINICAL DATA:  Acute onset of shortness of breath and rales. Cough and congestion. Initial encounter. EXAM: PORTABLE CHEST 1 VIEW COMPARISON:  Chest radiograph performed 12/21/2016 FINDINGS: The lungs are well-aerated and clear. There is no evidence of focal opacification, pleural effusion or pneumothorax. The cardiomediastinal silhouette is within normal limits. No acute osseous abnormalities are seen. IMPRESSION: No acute cardiopulmonary process seen. Electronically Signed   By: Roanna RaiderJeffery  Chang M.D.   On: 12/22/2016 01:02    Microbiology: No results found for this or any previous visit (from the past 240 hour(s)).   Labs: Basic Metabolic Panel:  Recent Labs Lab 12/20/16 0425 12/21/16 1434  NA 141 141  K 3.4* 3.4*  CL 106 110  CO2 25 26  GLUCOSE 118* 124*  BUN 14 10  CREATININE 0.71 0.74  CALCIUM 8.9 8.8*   Liver Function Tests:  Recent Labs Lab 12/20/16 0425  AST 31  ALT 25  ALKPHOS 40  BILITOT 0.6  PROT 6.9  ALBUMIN 3.8   No results for input(s): LIPASE, AMYLASE in the last 168 hours. No results for input(s): AMMONIA in the last 168 hours. CBC:  Recent Labs Lab 12/20/16 0425 12/21/16 1434  WBC 12.0* 13.8*  NEUTROABS  --  10.3*  HGB 12.4 12.5  HCT 38.5 38.5  MCV 87.3 86.9  PLT 295 256   Cardiac Enzymes: No results for  input(s): CKTOTAL, CKMB, CKMBINDEX, TROPONINI in the last 168 hours. BNP: BNP (last 3 results) No results for input(s): BNP in the last 8760 hours.  ProBNP (last 3 results) No results for input(s): PROBNP in the last 8760 hours.  CBG:  Recent Labs Lab 12/22/16 0903  GLUCAP 207*       Signed:  Rhetta MuraSAMTANI, JAI-GURMUKH MD   [facility] 12/22/2016, 10:01 AM

## 2016-12-22 NOTE — Progress Notes (Signed)
Spoke with patient at bedside. Reviewed medications with her, unable to afford Advair, not likely to get advair and other meds covered with MATCH d/t cost. Consulted attending and he substituted Advair with Pulmicort, prescription written. [facility], only can be used once per year, limited cost, each script $3. Patient agreeable, states she will have insurance with her new job soon. Patient has been using [facility] for primary care but unsure if she can continue without insurance, provided her with other options for primary care if needed, also resources for P4HM, government exchange, orange card etc. No further HH needs assessed, plan for d/c today.

## 2016-12-22 NOTE — Progress Notes (Addendum)
RN, Okey Regalarol, paged because pt was SOB, especially when OOB, and lung sounds tight with wheezing noted. No desaturation. NP spoke with RT, Tabatha, who requested a hour long neb. Ordered that and 100mg  Solumedrol. NP to bedside. S: Feels SOB when OOB. Breathing "OK" now. No other issues.  O: Appears well. VS reviewed. No tachypnea or increased WOB. No audible wheezing. She is able to tell me a story and answer all questions without SOB.  A/P: 1. Asthma exacerbation-100mg  Solumedrol now with hour long neb. Increase routine Solumedrol to 60mg  qid. If pt remains wheezy and tight, will move to higher level of care. Watch carefully.  2. Tachycardia-suspected with nebs. Will follow.  [facility], NP [facility] Update: Soyla Murphyalled Carol, RN to check on pt. RN states pt is resting comfortably. HR down. 02Sat 100% on RA.RR 20-24. No c/o SOB. CXR was same as before. Next Solumedrol due at 0600.  KJKG, NP [facility]

## 2016-12-22 NOTE — Progress Notes (Signed)
Gave and instructed the patient on how to do peak flow and use spacer. Peak flow was 300 which was in the yellow.

## 2016-12-22 NOTE — Progress Notes (Signed)
12/22/16  1011  Reviewed discharge instructions with patient. Patient verbalized understanding of discharge instructions. Copy of discharge instructions, prescriptions, and work not given to patient. Patient strongly advised to make sure she gets her prescriptions filled and use medicines as prescribed.

## 2017-10-10 ENCOUNTER — Encounter (HOSPITAL_COMMUNITY): Payer: Self-pay | Admitting: Emergency Medicine

## 2017-10-10 ENCOUNTER — Emergency Department (HOSPITAL_COMMUNITY)
Admission: EM | Admit: 2017-10-10 | Discharge: 2017-10-10 | Disposition: A | Discharge status: Home / Self Care | Payer: Self-pay | Attending: Emergency Medicine | Admitting: Emergency Medicine

## 2017-10-10 ENCOUNTER — Other Ambulatory Visit: Payer: Self-pay

## 2017-10-10 DIAGNOSIS — M5441 Lumbago with sciatica, right side: Secondary | ICD-10-CM | POA: Insufficient documentation

## 2017-10-10 DIAGNOSIS — Z87891 Personal history of nicotine dependence: Secondary | ICD-10-CM | POA: Insufficient documentation

## 2017-10-10 DIAGNOSIS — Z79899 Other long term (current) drug therapy: Secondary | ICD-10-CM | POA: Insufficient documentation

## 2017-10-10 DIAGNOSIS — J45909 Unspecified asthma, uncomplicated: Secondary | ICD-10-CM | POA: Insufficient documentation

## 2017-10-10 MED ORDER — METHYLPREDNISOLONE SODIUM SUCC 125 MG IJ SOLR
125.0000 mg | Freq: Once | INTRAMUSCULAR | Status: AC
Start: 2017-10-10 — End: 2017-10-10
  Administered 2017-10-10: 125 mg via INTRAMUSCULAR
  Filled 2017-10-10: qty 2

## 2017-10-10 MED ORDER — KETOROLAC TROMETHAMINE 60 MG/2ML IM SOLN
60.0000 mg | Freq: Once | INTRAMUSCULAR | Status: AC
  Administered 2017-10-10: 60 mg via INTRAMUSCULAR
  Filled 2017-10-10: qty 2

## 2017-10-10 MED ORDER — PREDNISONE 10 MG PO TABS: ORAL_TABLET | ORAL | 0 refills | Status: DC

## 2017-10-10 MED ORDER — HYDROCODONE-ACETAMINOPHEN 5-325 MG PO TABS: 2.0000 | ORAL_TABLET | ORAL | 0 refills | Status: DC | PRN

## 2017-10-10 MED ORDER — HYDROMORPHONE HCL 1 MG/ML IJ SOLN
1.0000 mg | Freq: Once | INTRAMUSCULAR | Status: AC
  Administered 2017-10-10: 1 mg via INTRAMUSCULAR
  Filled 2017-10-10: qty 1

## 2017-10-10 NOTE — Discharge Instructions (Signed)
Return if any problems.

## 2017-10-10 NOTE — ED Triage Notes (Signed)
Patient c/o lower back pain for 2 weeks that radiates down her left leg. Denies any injuries or falls. Patient reports had surgery last year due to shattered disc in her back.

## 2017-10-10 NOTE — ED Provider Notes (Signed)
[facility]-EMERGENCY DEPT Provider Note   CSN: [id number] Arrival date & time: 10/10/17  1902     History   Chief Complaint Chief Complaint  Patient presents with  . Back Pain  . Leg Pain    left    HPI Sarah Hatfield is a 22 y.o. female.  The history is provided by the patient. No language interpreter was used.  Back Pain   This is a new problem. Episode onset: 1 year. The problem occurs constantly. The problem has been gradually worsening. The pain is present in the lumbar spine. The quality of the pain is described as stabbing and shooting. The pain radiates to the right thigh. Associated symptoms include leg pain. She has tried nothing for the symptoms. Risk factors include obesity.  Leg Pain    Pt had surgery a year ago by Dr. Yetta Barre.  Pt reports pain has progressively worsened since surgery.  Pt reports she has to have a referral from ED in order to see him again.     Past Medical History:  Diagnosis Date  . Asthma    rare use of inhaler, last time couple of yrs. ago  . Family history of adverse reaction to anesthesia    sister- wakes up during surgery    Patient Active Problem List   Diagnosis Date Noted  . Asthma exacerbation 12/21/2016  . Adjustment disorder with depressed mood 04/17/2016  . S/P lumbar laminectomy 10/23/2015    Past Surgical History:  Procedure Laterality Date  . BACK SURGERY    . LUMBAR LAMINECTOMY/DECOMPRESSION MICRODISCECTOMY Left 10/23/2015   Procedure: LUMBAR LAMINECTOMY/DECOMPRESSION MICRODISCECTOMY LUMBAR FOUR-FIVE;  Surgeon: Tia Alert, MD;  Location: [facility];  Service: Neurosurgery;  Laterality: Left;  . UPPER GI ENDOSCOPY     done as a child, told wnl    OB History    No data available       Home Medications    Prior to Admission medications   Medication Sig Start Date End Date Taking? Authorizing Provider  albuterol (PROVENTIL HFA;VENTOLIN HFA) 108 (90 Base) MCG/ACT inhaler Inhale 2 puffs  into the lungs every 4 (four) hours as needed for wheezing or shortness of breath. 12/22/16   Rhetta Mura, MD  benzonatate (TESSALON) 100 MG capsule Take 2 capsules (200 mg total) by mouth 2 (two) times daily as needed for cough. 12/22/16   Rhetta Mura, MD  budesonide (PULMICORT) 180 MCG/ACT inhaler Inhale 2 puffs into the lungs 2 (two) times daily. 12/22/16   Rhetta Mura, MD  HYDROcodone-acetaminophen (NORCO/VICODIN) 5-325 MG tablet Take 2 tablets by mouth every 4 (four) hours as needed. 10/10/17   Elson Areas, PA-C  ibuprofen (ADVIL,MOTRIN) 200 MG tablet Take 400 mg by mouth every 6 (six) hours as needed for fever, headache, mild pain, moderate pain or cramping.     [provider]  montelukast (SINGULAIR) 10 MG tablet Take 1 tablet (10 mg total) by mouth at bedtime. 12/22/16   Rhetta Mura, MD  predniSONE (DELTASONE) 10 MG tablet 6,5,4,3,2,1 taper 10/10/17   Elson Areas, PA-C    Family History Family History  Problem Relation Age of Onset  . Cancer Mother   . Asthma Mother   . Diabetes Other   . Hypertension Other     Social History Social History   Tobacco Use  . Smoking status: Former Games developer  . Smokeless tobacco: Never Used  Substance Use Topics  . Alcohol use: Yes    Comment: social, occasionally   .  Drug use: No     Allergies   Patient has no known allergies.   Review of Systems Review of Systems  Musculoskeletal: Positive for back pain.  All other systems reviewed and are negative.    Physical Exam Updated Vital Signs BP (!) 136/98 (BP Location: Right Arm)   Pulse (!) 111   Temp 98.1 F (36.7 C) (Oral)   Resp 20   Ht 6\' 1"  (1.854 m)   Wt (!) 158.8 kg (350 lb)   SpO2 99%   BMI 46.18 kg/m   Physical Exam  Constitutional: She appears well-developed and well-nourished.  HENT:  Head: Normocephalic.  Cardiovascular: Normal rate and regular rhythm.  Pulmonary/Chest: Effort normal and breath sounds normal.    Abdominal: Soft.  Musculoskeletal:  Pain with movement,  Tender ls spine   Neurological: She is alert.  Skin: Skin is warm.  Psychiatric: She has a normal mood and affect.     ED Treatments / Results  Labs (all labs ordered are listed, but only abnormal results are displayed) Labs Reviewed - No data to display  EKG  EKG Interpretation None       Radiology No results found.  Procedures Procedures (including critical care time)  Medications Ordered in ED Medications  methylPREDNISolone sodium succinate (SOLU-MEDROL) 125 mg/2 mL injection 125 mg (not administered)  ketorolac (TORADOL) injection 60 mg (not administered)  HYDROmorphone (DILAUDID) injection 1 mg (not administered)     Initial Impression / Assessment and Plan / ED Course  I have reviewed the triage vital signs and the nursing notes.  Pertinent labs & imaging results that were available during my care of the patient were reviewed by me and considered in my medical decision making (see chart for details).     Pt given injection of torodol, solumedrol and dilaudid here.  Pt given rx for prednisone and Hydrocodone.  Pt advised to call Dr. Yetta BarreJones office for follow up.    Final Clinical Impressions(s) / ED Diagnoses   Final diagnoses:  Acute low back pain with right-sided sciatica, unspecified back pain laterality    ED Discharge Orders        Ordered    predniSONE (DELTASONE) 10 MG tablet     10/10/17 2157    HYDROcodone-acetaminophen (NORCO/VICODIN) 5-325 MG tablet  Every 4 hours PRN     10/10/17 2157    An After Visit Summary was printed and given to the patient.   Osie CheeksSofia, Leslie K, PA-C 10/10/17 2212    Terrilee FilesButler, Michael C, MD 10/11/17 [phone]

## 2017-10-10 NOTE — ED Notes (Addendum)
Pt is alert and oriented x 4 and is verbally responsive. Pt reports that she has 8/10 lower back pain that radiates to left leg and has occasional numbness to left leg Pt MD is aware. Pt reports that the past 2 days has pain exacerbation, pt reports that she always has pain since surgery .

## 2017-10-10 NOTE — ED Provider Notes (Signed)
Patient placed in Quick Look pathway, seen and evaluated   Chief Complaint: back pain with pain down left leg.  HPI:   Back pain for 2 months, history of back surgery one year ago.    ROS: no abdominal pain   Physical Exam:   Gen: No distress  Neuro: Awake and Alert  Skin: Warm    Focused Exam: tender ls spine    Initiation of care has begun. The patient has been counseled on the process, plan, and necessity for staying for the completion/evaluation, and the remainder of the medical screening examination   Sarah CheeksSofia, Sarah Jay K, PA-C 10/10/17 1910    Terrilee FilesButler, Michael C, MD 10/11/17 [id number]

## 2017-10-20 ENCOUNTER — Other Ambulatory Visit (HOSPITAL_COMMUNITY): Payer: Self-pay | Admitting: Student

## 2017-10-20 DIAGNOSIS — M5416 Radiculopathy, lumbar region: Secondary | ICD-10-CM

## 2017-10-27 ENCOUNTER — Ambulatory Visit (HOSPITAL_COMMUNITY)
Admission: RE | Admit: 2017-10-27 | Discharge: 2017-10-27 | Disposition: A | Discharge status: Home / Self Care | Payer: Medicaid Other | Source: Ambulatory Visit | Attending: Student | Admitting: Student

## 2017-10-27 DIAGNOSIS — M5127 Other intervertebral disc displacement, lumbosacral region: Secondary | ICD-10-CM | POA: Insufficient documentation

## 2017-10-27 DIAGNOSIS — M5126 Other intervertebral disc displacement, lumbar region: Secondary | ICD-10-CM | POA: Insufficient documentation

## 2017-10-27 DIAGNOSIS — R609 Edema, unspecified: Secondary | ICD-10-CM | POA: Insufficient documentation

## 2017-10-27 DIAGNOSIS — M48061 Spinal stenosis, lumbar region without neurogenic claudication: Secondary | ICD-10-CM | POA: Insufficient documentation

## 2017-10-27 DIAGNOSIS — M5416 Radiculopathy, lumbar region: Secondary | ICD-10-CM | POA: Insufficient documentation

## 2017-10-27 LAB — CREATININE, SERUM
Creatinine, Ser: 0.73 mg/dL (ref 0.44–1.00)
GFR calc Af Amer: >60 mL/min (ref >60)
GFR calc non Af Amer: >60 mL/min (ref >60)

## 2017-10-27 MED ORDER — GADOBENATE DIMEGLUMINE 529 MG/ML IV SOLN
20.0000 mL | Freq: Once | INTRAVENOUS | Status: AC | PRN
  Administered 2017-10-27: 20 mL via INTRAVENOUS

## 2017-12-01 ENCOUNTER — Other Ambulatory Visit: Payer: Self-pay | Admitting: Student

## 2017-12-01 DIAGNOSIS — M5416 Radiculopathy, lumbar region: Secondary | ICD-10-CM

## 2017-12-21 ENCOUNTER — Inpatient Hospital Stay
Admission: RE | Admit: 2017-12-21 | Discharge: 2017-12-21 | Disposition: A | Discharge status: Home / Self Care | Payer: Medicaid Other | Source: Ambulatory Visit | Attending: Student | Admitting: Student

## 2017-12-27 ENCOUNTER — Ambulatory Visit
Admission: RE | Admit: 2017-12-27 | Discharge: 2017-12-27 | Disposition: A | Discharge status: Home / Self Care | Payer: Medicaid Other | Source: Ambulatory Visit | Attending: Student | Admitting: Student

## 2017-12-27 DIAGNOSIS — M5416 Radiculopathy, lumbar region: Secondary | ICD-10-CM

## 2017-12-27 MED ORDER — METHYLPREDNISOLONE ACETATE 40 MG/ML INJ SUSP (RADIOLOG: 120.0000 mg | Freq: Once | INTRAMUSCULAR | Status: DC

## 2017-12-27 MED ORDER — IOPAMIDOL (ISOVUE-M 200) INJECTION 41%: 1.0000 mL | Freq: Once | INTRAMUSCULAR | Status: DC

## 2018-01-11 ENCOUNTER — Ambulatory Visit (HOSPITAL_COMMUNITY)
Admission: RE | Admit: 2018-01-11 | Discharge: 2018-01-11 | Disposition: A | Discharge status: Home / Self Care | Payer: Medicaid Other | Source: Ambulatory Visit | Attending: Student | Admitting: Student

## 2018-01-11 DIAGNOSIS — M47817 Spondylosis without myelopathy or radiculopathy, lumbosacral region: Secondary | ICD-10-CM | POA: Insufficient documentation

## 2018-01-11 DIAGNOSIS — M79605 Pain in left leg: Secondary | ICD-10-CM | POA: Insufficient documentation

## 2018-01-11 DIAGNOSIS — M5416 Radiculopathy, lumbar region: Secondary | ICD-10-CM

## 2018-01-11 DIAGNOSIS — M5126 Other intervertebral disc displacement, lumbar region: Secondary | ICD-10-CM | POA: Insufficient documentation

## 2018-01-11 HISTORY — PX: IR INJECT/THERA/INC NEEDLE/CATH/PLC EPI/LUMB/SAC W/IMG: IMG6130

## 2018-01-11 MED ORDER — METHYLPREDNISOLONE ACETATE 40 MG/ML IJ SUSP
INTRAMUSCULAR | Status: AC
  Filled 2018-01-11: qty 1

## 2018-01-11 MED ORDER — METHYLPREDNISOLONE ACETATE 80 MG/ML IJ SUSP
INTRAMUSCULAR | Status: AC
  Filled 2018-01-11: qty 1

## 2018-01-11 MED ORDER — LIDOCAINE HCL (PF) 1 % IJ SOLN
INTRAMUSCULAR | Status: AC
  Filled 2018-01-11: qty 30

## 2018-01-11 MED ORDER — BUPIVACAINE HCL (PF) 0.25 % IJ SOLN
INTRAMUSCULAR | Status: AC
  Filled 2018-01-11: qty 30

## 2018-01-11 MED ORDER — IOPAMIDOL (ISOVUE-M 200) INJECTION 41%
INTRAMUSCULAR | Status: AC
  Filled 2018-01-11: qty 10

## 2018-01-13 ENCOUNTER — Encounter (HOSPITAL_COMMUNITY): Payer: Self-pay | Admitting: Interventional Radiology

## 2018-01-14 ENCOUNTER — Other Ambulatory Visit: Payer: Self-pay

## 2018-01-14 ENCOUNTER — Encounter (HOSPITAL_COMMUNITY): Payer: Self-pay | Admitting: Emergency Medicine

## 2018-01-14 ENCOUNTER — Emergency Department (HOSPITAL_COMMUNITY): Payer: Medicaid Other

## 2018-01-14 ENCOUNTER — Emergency Department (HOSPITAL_COMMUNITY)
Admission: EM | Admit: 2018-01-14 | Discharge: 2018-01-14 | Disposition: A | Discharge status: Home / Self Care | Payer: Medicaid Other | Attending: Emergency Medicine | Admitting: Emergency Medicine

## 2018-01-14 DIAGNOSIS — Z87891 Personal history of nicotine dependence: Secondary | ICD-10-CM | POA: Insufficient documentation

## 2018-01-14 DIAGNOSIS — Y939 Activity, unspecified: Secondary | ICD-10-CM | POA: Insufficient documentation

## 2018-01-14 DIAGNOSIS — Y929 Unspecified place or not applicable: Secondary | ICD-10-CM | POA: Insufficient documentation

## 2018-01-14 DIAGNOSIS — Y990 Civilian activity done for income or pay: Secondary | ICD-10-CM | POA: Insufficient documentation

## 2018-01-14 DIAGNOSIS — W010XXA Fall on same level from slipping, tripping and stumbling without subsequent striking against object, initial encounter: Secondary | ICD-10-CM | POA: Insufficient documentation

## 2018-01-14 DIAGNOSIS — Z79899 Other long term (current) drug therapy: Secondary | ICD-10-CM | POA: Insufficient documentation

## 2018-01-14 DIAGNOSIS — J45909 Unspecified asthma, uncomplicated: Secondary | ICD-10-CM | POA: Insufficient documentation

## 2018-01-14 DIAGNOSIS — S93402A Sprain of unspecified ligament of left ankle, initial encounter: Secondary | ICD-10-CM | POA: Insufficient documentation

## 2018-01-14 NOTE — ED Provider Notes (Signed)
[facility] EMERGENCY DEPARTMENT Provider Note   CSN: [id number] Arrival date & time: 01/14/18  1135     History   Chief Complaint Chief Complaint  Patient presents with  . Ankle Pain    HPI Sarah Hatfield is a 22 y.o. female possible history of asthma who presents for evaluation of left ankle pain after mechanical fall that occurred yesterday.  Patient reports she was at work when she tripped, causing an inversion injury of her left ankle.  Patient reports that she was able to ambulate and bear weight after the incident.  Patient reports that today, she had more pain, swelling to the right ankle.  She reports she is able to ambulate but does report worsening pain with ambulation.  Patient states she has not taken anything for the pain.  Patient denies any fevers, numbness/weakness.  The history is provided by the patient.    Past Medical History:  Diagnosis Date  . Asthma    rare use of inhaler, last time couple of yrs. ago  . Family history of adverse reaction to anesthesia    sister- wakes up during surgery    Patient Active Problem List   Diagnosis Date Noted  . Asthma exacerbation 12/21/2016  . Adjustment disorder with depressed mood 04/17/2016  . S/P lumbar laminectomy 10/23/2015    Past Surgical History:  Procedure Laterality Date  . BACK SURGERY    . IR INJECT/THERA/INC NEEDLE/CATH/PLC EPI/LUMB/SAC W/IMG  01/11/2018  . LUMBAR LAMINECTOMY/DECOMPRESSION MICRODISCECTOMY Left 10/23/2015   Procedure: LUMBAR LAMINECTOMY/DECOMPRESSION MICRODISCECTOMY LUMBAR FOUR-FIVE;  Surgeon: Tia Alert, MD;  Location: [facility];  Service: Neurosurgery;  Laterality: Left;  . UPPER GI ENDOSCOPY     done as a child, told wnl     OB History   None      Home Medications    Prior to Admission medications   Medication Sig Start Date End Date Taking? Authorizing Provider  albuterol (PROVENTIL HFA;VENTOLIN HFA) 108 (90 Base) MCG/ACT inhaler Inhale 2 puffs  into the lungs every 4 (four) hours as needed for wheezing or shortness of breath. 12/22/16   Rhetta Mura, MD  benzonatate (TESSALON) 100 MG capsule Take 2 capsules (200 mg total) by mouth 2 (two) times daily as needed for cough. 12/22/16   Rhetta Mura, MD  budesonide (PULMICORT) 180 MCG/ACT inhaler Inhale 2 puffs into the lungs 2 (two) times daily. 12/22/16   Rhetta Mura, MD  HYDROcodone-acetaminophen (NORCO/VICODIN) 5-325 MG tablet Take 2 tablets by mouth every 4 (four) hours as needed. 10/10/17   Elson Areas, PA-C  ibuprofen (ADVIL,MOTRIN) 200 MG tablet Take 400 mg by mouth every 6 (six) hours as needed for fever, headache, mild pain, moderate pain or cramping.     [provider]  montelukast (SINGULAIR) 10 MG tablet Take 1 tablet (10 mg total) by mouth at bedtime. 12/22/16   Rhetta Mura, MD  predniSONE (DELTASONE) 10 MG tablet 6,5,4,3,2,1 taper 10/10/17   Elson Areas, PA-C    Family History Family History  Problem Relation Age of Onset  . Cancer Mother   . Asthma Mother   . Diabetes Other   . Hypertension Other     Social History Social History   Tobacco Use  . Smoking status: Former Games developer  . Smokeless tobacco: Never Used  Substance Use Topics  . Alcohol use: Yes    Comment: social, occasionally   . Drug use: No     Allergies   Patient has no known  allergies.   Review of Systems Review of Systems  Constitutional: Negative for fever.  Musculoskeletal:       Left ankle pain  Neurological: Negative for weakness and numbness.     Physical Exam Updated Vital Signs BP (!) 153/95 (BP Location: Left Arm)   Pulse 72   Temp 98.3 F (36.8 C) (Oral)   Resp 20   LMP 08/16/2017 Comment: irregular periods   SpO2 99%   Physical Exam  Constitutional: She appears well-developed and well-nourished.  HENT:  Head: Normocephalic and atraumatic.  Eyes: EOM are normal.  Neck: Normal range of motion.  Cardiovascular:  Pulses:       Dorsalis pedis pulses are 2+ on the right side, and 2+ on the left side.  Pulmonary/Chest: Effort normal.  Musculoskeletal:  Tenderness to palpation to the lateral malleolus of the left ankle.  There is some overlying soft tissue swelling, particularly at the left malleolus.  No verlying ecchymosis, warmth, or erythema. No deformity or crepitus noted.  Dorsiflexion and plantarflexion of left ankle intact without any difficulty.  Patient able to move all 5 digits of left foot without any difficulty.  No tenderness palpation of the distal tib-fib, proximal tibia, proximal knee.  No abnormalities of the right lower extremity.  Neurological:  Sensation intact throughout all major nerve distributions of the feet   Skin: Skin is warm and dry. Capillary refill takes less than 2 seconds.  The skin is intact to ankle/foot.  The foot is warm and well perfused with intact sensation  Nursing note and vitals reviewed.    ED Treatments / Results  Labs (all labs ordered are listed, but only abnormal results are displayed) Labs Reviewed - No data to display  EKG None  Radiology Dg Ankle Complete Left  Result Date: 01/14/2018 CLINICAL DATA:  Fall yesterday with lateral left ankle pain, initial encounter. EXAM: LEFT ANKLE COMPLETE - 3+ VIEW COMPARISON:  None. FINDINGS: Soft tissue swelling over the lateral malleolus. Ankle mortise is intact. No acute fracture. There may be a joint effusion. IMPRESSION: Possible joint effusion. Lateral soft tissue swelling. No definite fracture. Electronically Signed   By: Leanna BattlesMelinda  Blietz M.D.   On: 01/14/2018 12:18    Procedures Procedures (including critical care time)  Medications Ordered in ED Medications - No data to display   Initial Impression / Assessment and Plan / ED Course  I have reviewed the triage vital signs and the nursing notes.  Pertinent labs & imaging results that were available during my care of the patient were reviewed by me and considered  in my medical decision making (see chart for details).     22 y.o. F Presents with left ankle pain after mechanical fall consistent with an ankle sprain/strain.  Vital signs reviewed and stable. Patient is neurovascularly intact. Consider sprain vs fracture vs dislocation.  XRs ordered.   XR reviewed. Negative for any acute fracture or dislocation.  They do mention lateral soft tissue swelling as well as possibility of a possible effusion.   Discussed results with patient.  Explained results to patient and discussed that there could be a ligamentous or muscular injury that cannot be picked up on XR. Plan to send patient home with a ASO splint.  Patient does not wish to have crutches at this time. Will provide ortho referral to be seen if there is no improvement in symptoms.  Patient had ample opportunity for questions and discussion. All patient's questions were answered with full understanding. Strict return precautions  discussed. Patient expresses understanding and agreement to plan.   Final Clinical Impressions(s) / ED Diagnoses   Final diagnoses:  Sprain of left ankle, unspecified ligament, initial encounter    ED Discharge Orders    None       Rosana Hoes 01/14/18 1324    Alvira Monday, MD 01/14/18 2328

## 2018-01-14 NOTE — Discharge Instructions (Signed)
Follow up with your Primary Care Doctor as needed.  ° Follow up with referred orthopedic doctor in 1-2 weeks if no improvement of pain.  ° °You can take tylenol or ibuprofen as needed for pain. You can alternate Tylenol and Ibuprofen every 4 hours for additional pain relief.  °  °Return to the Emergency Department immediately for any worsening pain, redness/swelling of the ankle, gray or blue color to the toes, numbness/weakness of toes or foot, difficulty walking or any other worsening or concerning symptoms.  ° ° °Ankle sprain °Ankle sprain occurs when the ligaments that hold the ankle joint to get her are stretched or torn. It may take 4-6 weeks to heal. ° °For activity: Use crutches with nonweightbearing for the first few days. Then, you may walk on your ankles as the pain allows, or as instructed. Start gradually with weight bearing on the affected ankle. Once you can walk pain free, then try jogging. When you can run forwards, then you can try moving side to side. If you cannot walk without crutches in one week, you need a recheck by your Family Doctor. ° °If you do not have a family doctor to followup with, you can see the list of phone numbers below. Please call today to make a followup appointment. ° ° °RICE therapy:  Routine Care for injuries ° °Rest, Ice, Compression, Elevation (RICE) ° °Rest is needed to allow your body to heal. Routine activities can be resumed when comfortable. Injury tendons and bones can take up to 6 weeks to heal. Tendons are cordlike structures that attach muscles and bones. ° °Ice following an injury helps keep the swelling down and reduce the pain. Put ice in a plastic bag. Place a towel between your skin and the bag of ice. Leave the ice on for 15-20 minutes, 3-4 times a day. Do this while awake, for the first 24-48 hours. After that continue as directed by your caregiver. ° °Compression helps keep swelling down. It also gives support and helps with discomfort. If any lasting  bandage has been applied, it should be removed and reapplied every 3-4 hours. It should not be applied tightly, but firmly enough to keep swelling down. Watch fingers or toes for swelling, discoloration, coldness, numbness or excessive pain. If any of these problems occur, removed the bandage and reapply loosely. Contact your caregiver if these problems continue. ° °Elevation helps reduce swelling and decrease your pain. With extremities such as the arms, hands, legs and feet, the injured area should be placed near or above the level of the heart if possible. ° ° ° °

## 2018-01-14 NOTE — ED Triage Notes (Signed)
Patient presents to ED for assessment of left ankle pain after a trip and fall yesterday.

## 2018-02-07 ENCOUNTER — Other Ambulatory Visit (HOSPITAL_COMMUNITY): Payer: Self-pay | Admitting: Student

## 2018-02-07 ENCOUNTER — Other Ambulatory Visit: Payer: Self-pay | Admitting: Student

## 2018-02-07 DIAGNOSIS — M5416 Radiculopathy, lumbar region: Secondary | ICD-10-CM

## 2018-02-13 ENCOUNTER — Encounter (HOSPITAL_COMMUNITY): Payer: Self-pay | Admitting: *Deleted

## 2018-02-13 ENCOUNTER — Emergency Department (HOSPITAL_COMMUNITY)
Admission: EM | Admit: 2018-02-13 | Discharge: 2018-02-13 | Disposition: A | Discharge status: Home / Self Care | Payer: Medicaid Other | Attending: Emergency Medicine | Admitting: Emergency Medicine

## 2018-02-13 DIAGNOSIS — M549 Dorsalgia, unspecified: Secondary | ICD-10-CM | POA: Insufficient documentation

## 2018-02-13 DIAGNOSIS — Z5321 Procedure and treatment not carried out due to patient leaving prior to being seen by health care provider: Secondary | ICD-10-CM | POA: Insufficient documentation

## 2018-02-13 NOTE — ED Triage Notes (Signed)
Pt complains of back pain radiating down left leg. Pt has hx of back surgery. Pt has appointment for cortisone injection but was unable to go to work today and needs a doctor's note to return.

## 2018-02-13 NOTE — ED Notes (Signed)
Bed: [facility] Expected date:  Expected time:  Means of arrival:  Comments: 

## 2018-02-21 ENCOUNTER — Encounter (HOSPITAL_COMMUNITY): Payer: Self-pay | Admitting: Interventional Radiology

## 2018-02-21 ENCOUNTER — Ambulatory Visit (HOSPITAL_COMMUNITY)
Admission: RE | Admit: 2018-02-21 | Discharge: 2018-02-21 | Disposition: A | Discharge status: Home / Self Care | Payer: Medicaid Other | Source: Ambulatory Visit | Attending: Student | Admitting: Student

## 2018-02-21 DIAGNOSIS — M5416 Radiculopathy, lumbar region: Secondary | ICD-10-CM

## 2018-02-21 DIAGNOSIS — M5442 Lumbago with sciatica, left side: Secondary | ICD-10-CM | POA: Insufficient documentation

## 2018-02-21 DIAGNOSIS — G8929 Other chronic pain: Secondary | ICD-10-CM | POA: Insufficient documentation

## 2018-02-21 DIAGNOSIS — M47817 Spondylosis without myelopathy or radiculopathy, lumbosacral region: Secondary | ICD-10-CM | POA: Insufficient documentation

## 2018-02-21 DIAGNOSIS — M5126 Other intervertebral disc displacement, lumbar region: Secondary | ICD-10-CM | POA: Insufficient documentation

## 2018-02-21 HISTORY — PX: IR INJECT/THERA/INC NEEDLE/CATH/PLC EPI/LUMB/SAC W/IMG: IMG6130

## 2018-02-21 MED ORDER — METHYLPREDNISOLONE ACETATE 40 MG/ML IJ SUSP
INTRAMUSCULAR | Status: AC | PRN
  Administered 2018-02-21: 40 mg via INTRA_ARTICULAR

## 2018-02-21 MED ORDER — METHYLPREDNISOLONE ACETATE 80 MG/ML IJ SUSP
INTRAMUSCULAR | Status: AC
  Filled 2018-02-21: qty 1

## 2018-02-21 MED ORDER — METHYLPREDNISOLONE ACETATE 80 MG/ML IJ SUSP
INTRAMUSCULAR | Status: AC | PRN
  Administered 2018-02-21: 80 mg via INTRA_ARTICULAR

## 2018-02-21 MED ORDER — HYDROCODONE-ACETAMINOPHEN 5-325 MG PO TABS: 1.0000 | ORAL_TABLET | ORAL | Status: DC | PRN

## 2018-02-21 MED ORDER — IOPAMIDOL (ISOVUE-M 200) INJECTION 41%
10.0000 mL | Freq: Once | INTRAMUSCULAR | Status: AC
  Administered 2018-02-21: 10 mL via EPIDURAL

## 2018-02-21 MED ORDER — LIDOCAINE HCL (PF) 1 % IJ SOLN
INTRAMUSCULAR | Status: AC
  Filled 2018-02-21: qty 30

## 2018-02-21 MED ORDER — SODIUM CHLORIDE 0.9 % IJ SOLN
INTRAMUSCULAR | Status: AC
  Filled 2018-02-21: qty 10

## 2018-02-21 MED ORDER — METHYLPREDNISOLONE ACETATE 40 MG/ML IJ SUSP
INTRAMUSCULAR | Status: AC
  Filled 2018-02-21: qty 1

## 2018-02-21 MED ORDER — IOPAMIDOL (ISOVUE-M 200) INJECTION 41%
INTRAMUSCULAR | Status: AC
  Administered 2018-02-21: 10 mL via EPIDURAL
  Filled 2018-02-21: qty 10

## 2018-02-21 MED ORDER — LIDOCAINE HCL (PF) 1 % IJ SOLN
INTRAMUSCULAR | Status: AC | PRN
  Administered 2018-02-21: 5 mL

## 2018-02-21 NOTE — Discharge Instructions (Signed)
Epidural Steroid Injection, Care After °Refer to this sheet in the next few weeks. These instructions provide you with information about caring for yourself after your procedure. Your health care provider may also give you more specific instructions. Your treatment has been planned according to current medical practices, but problems sometimes occur. Call your health care provider if you have any problems or questions after your procedure. °What can I expect after the procedure? °After your procedure, it is common to feel a little discomfort at the injection site. °Follow these instructions at home: °· For 24 hours after the procedure: °? Avoid using heat on the injection site. °? Do not take a tub bath, and do not soak in water. °? Do not drive if you received a medicine to help you relax (sedative). °· If directed, put ice on the injection site: °? Put ice in a plastic bag. °? Place a towel between your skin and the bag. °? Leave the ice on for 20 minutes, 2-3 times a day. °· Return to your normal activities as told by your health care provider. Ask your health care provider what activities are safe for you. °· You may remove the bandage (dressing) after 24 hours. °· Take over-the-counter and prescription medicines only as told by your health care provider. °· Keep all follow-up visits as told by your health care provider. This is important. °Contact a health care provider if: °· You have a fever. °· You continue to have pain and soreness around the injection site, even after taking over-the-counter pain medicine. °· You have severe, sudden, or lasting nausea or vomiting. °Get help right away if: °· You have severe pain at the injection site that is not relieved by medicines. °· You develop a severe headache or a stiff neck. °· You become sensitive to light. °· You have any new numbness or weakness in your legs or arms. °· You lose control of your bladder or bowel movements. °· You have trouble breathing. °This  information is not intended to replace advice given to you by your health care provider. Make sure you discuss any questions you have with your health care provider. °Document Released: 12/29/2010 Document Revised: 02/19/2016 Document Reviewed: 12/30/2015 °Elsevier Interactive Patient Education © 2018 [employer] ° °

## 2018-02-21 NOTE — Procedures (Signed)
  Procedure: L5 NRB/ TFE   EBL:   minimal Complications:  none immediate  See full dictation in [employer].  Thora Lance MD Main # [phone] Pager  [phone]

## 2018-05-08 ENCOUNTER — Other Ambulatory Visit (HOSPITAL_COMMUNITY): Payer: Self-pay | Admitting: Neurological Surgery

## 2018-05-08 DIAGNOSIS — M5430 Sciatica, unspecified side: Secondary | ICD-10-CM

## 2018-05-10 ENCOUNTER — Encounter (HOSPITAL_COMMUNITY): Payer: Self-pay

## 2018-05-10 ENCOUNTER — Other Ambulatory Visit (HOSPITAL_COMMUNITY): Payer: Medicaid Other

## 2018-05-15 ENCOUNTER — Ambulatory Visit (HOSPITAL_COMMUNITY)
Admission: RE | Admit: 2018-05-15 | Discharge: 2018-05-15 | Disposition: A | Discharge status: Home / Self Care | Payer: Self-pay | Source: Ambulatory Visit | Attending: Neurological Surgery | Admitting: Neurological Surgery

## 2018-05-15 ENCOUNTER — Encounter (HOSPITAL_COMMUNITY): Payer: Self-pay | Admitting: Interventional Radiology

## 2018-05-15 DIAGNOSIS — M5430 Sciatica, unspecified side: Secondary | ICD-10-CM

## 2018-05-15 DIAGNOSIS — M47817 Spondylosis without myelopathy or radiculopathy, lumbosacral region: Secondary | ICD-10-CM | POA: Insufficient documentation

## 2018-05-15 HISTORY — PX: IR EPIDUROGRAPHY: IMG2365

## 2018-05-15 MED ORDER — IOPAMIDOL (ISOVUE-M 200) INJECTION 41%
INTRAMUSCULAR | Status: AC
  Filled 2018-05-15: qty 10

## 2018-05-15 MED ORDER — METHYLPREDNISOLONE ACETATE 80 MG/ML IJ SUSP
INTRAMUSCULAR | Status: AC
  Filled 2018-05-15: qty 2

## 2018-05-15 MED ORDER — LIDOCAINE HCL (PF) 1 % IJ SOLN
INTRAMUSCULAR | Status: AC
  Filled 2018-05-15: qty 30

## 2018-05-15 MED ORDER — LIDOCAINE HCL (PF) 1 % IJ SOLN
INTRAMUSCULAR | Status: DC | PRN
  Administered 2018-05-15: 10 mL

## 2018-05-15 MED ORDER — STERILE WATER FOR INJECTION IJ SOLN
INTRAMUSCULAR | Status: AC
Start: 2018-05-15 — End: 2018-05-15
  Filled 2018-05-15: qty 10

## 2018-05-15 MED ORDER — SODIUM CHLORIDE 0.9 % IJ SOLN
INTRAMUSCULAR | Status: AC
  Filled 2018-05-15: qty 10

## 2018-05-15 NOTE — Procedures (Signed)
L L5/S1 epi 3 cc 1% lido 120 mg depomedrol EBL 0 Comp 0

## 2018-08-01 IMAGING — XA Imaging study
2 series · 3 of 3 positions shown · IV contrast (omnipaque)
Comparison: none

CLINICAL DATA: Lumbosacral spondylosis without myelopathy.
Improvement of low back pain after the interlaminar epidural
injection. Persistent left lower extremity pain. Protrusions L4-5
and L5-S1. Previous micro discectomy L4-5.

EXAM:
SELECTIVE NERVE ROOT BLOCK AND TRANSFORAMINAL EPIDURAL STEROID
INJECTION UNDER FLUOROSCOPY
FLUOROSCOPY TIME:  0.5 minutes; 415 uMymG DAP
TECHNIQUE: The procedure, risks (including but not limited to bleeding,
infection, organ damage ), benefits, and alternatives were explained
to the patient. Questions regarding the procedure were encouraged
and answered. The patient understands and consents to the procedure.
An appropriate skin entry site was determined under fluoroscopy.
Operator donned sterile gloves and mask. Site was marked, prepped
with Betadine, draped in usual sterile fashion, infiltrated locally
with 1% lidocaine. A 22 gauge spinal needle was advanced to the
superior ventral margin of the left L5-S1 neural foramen. Diagnostic
injection of 2 ml Omnipaque 180 showed partial outlining of the
exiting nerve root as well as epidural extension of contrast, with
no intravascular or subarachnoid component. 120 mg Depo-Medrol in 3
ml lidocaine 1% was administered. The patient tolerated procedure
well, with no immediate complication.

[Series 1: care single · 1 of 1 slices shown]
[im 1/1]
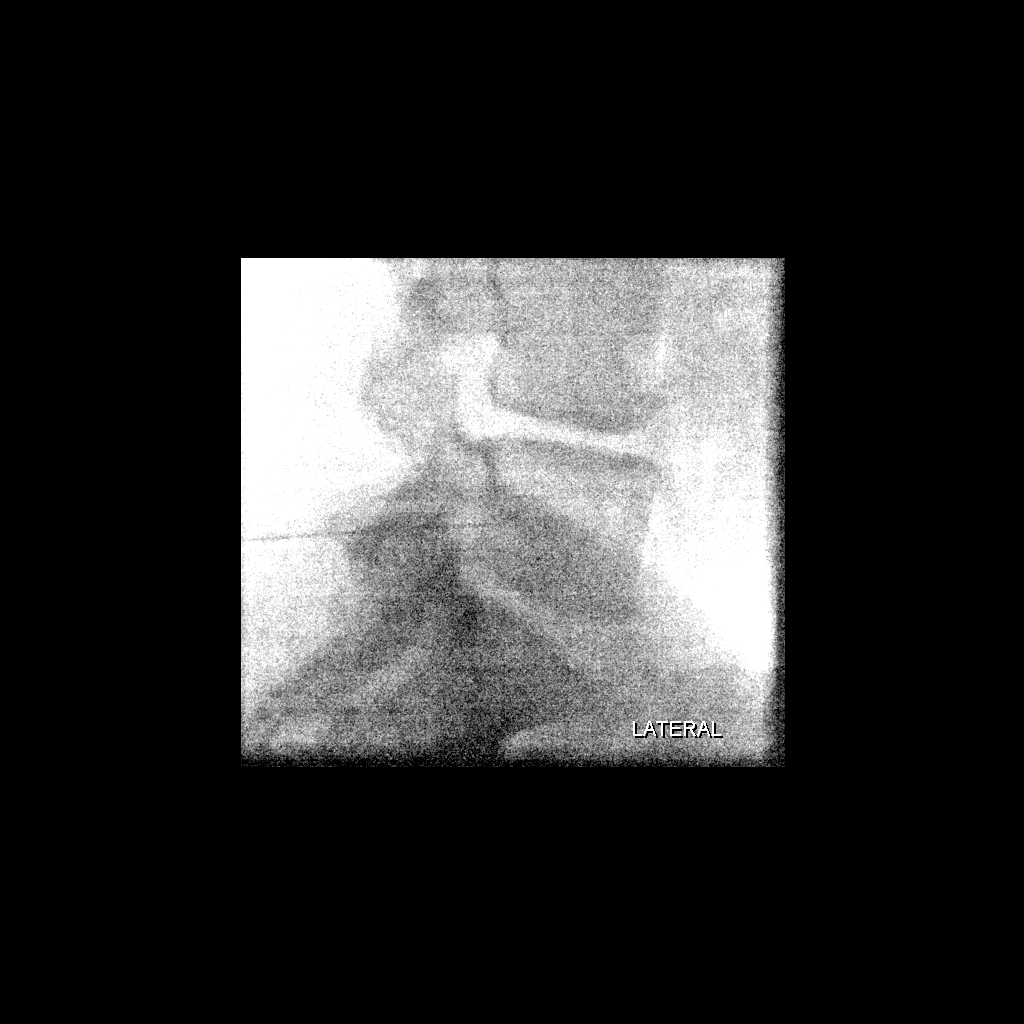

[Series 300: ir inject/(person_name)/inc needle/cath/ · 2 of 2 slices shown]
[im 1/2]
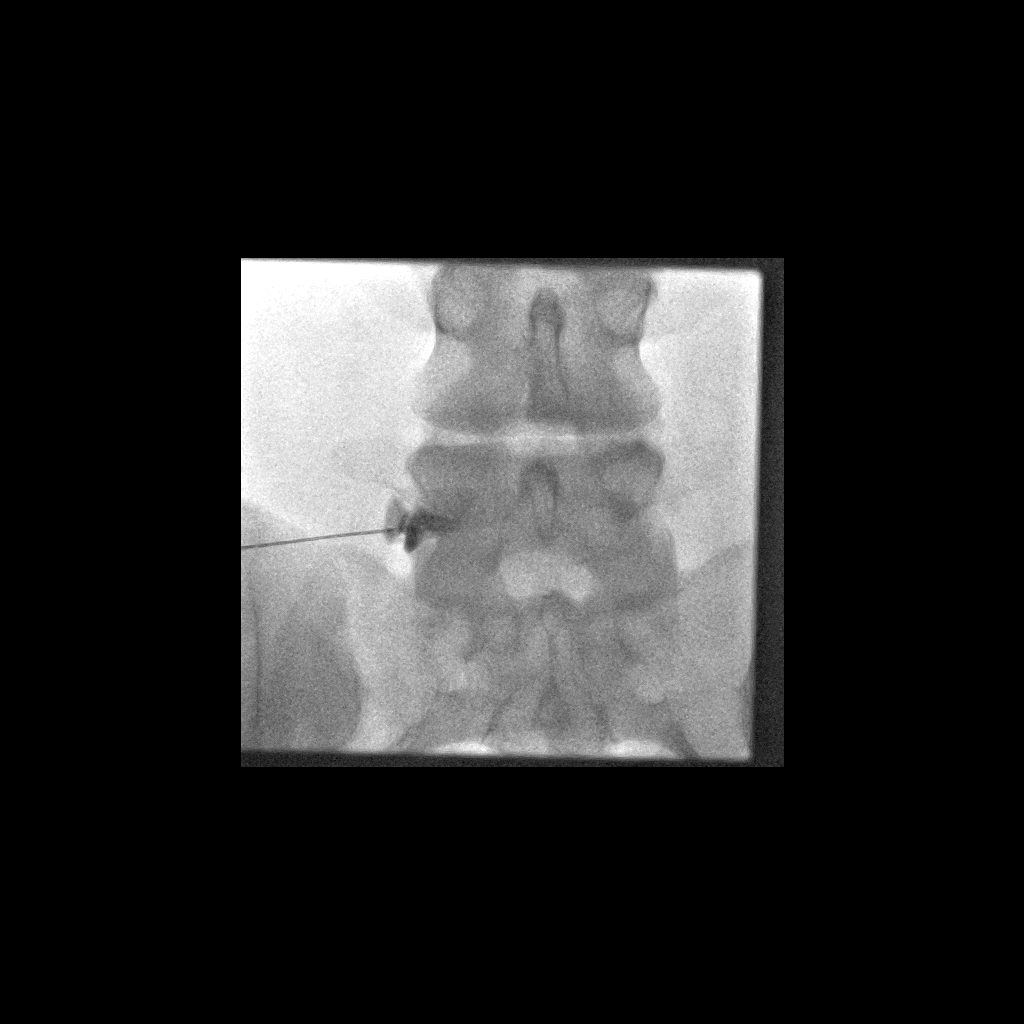
[im 2/2]
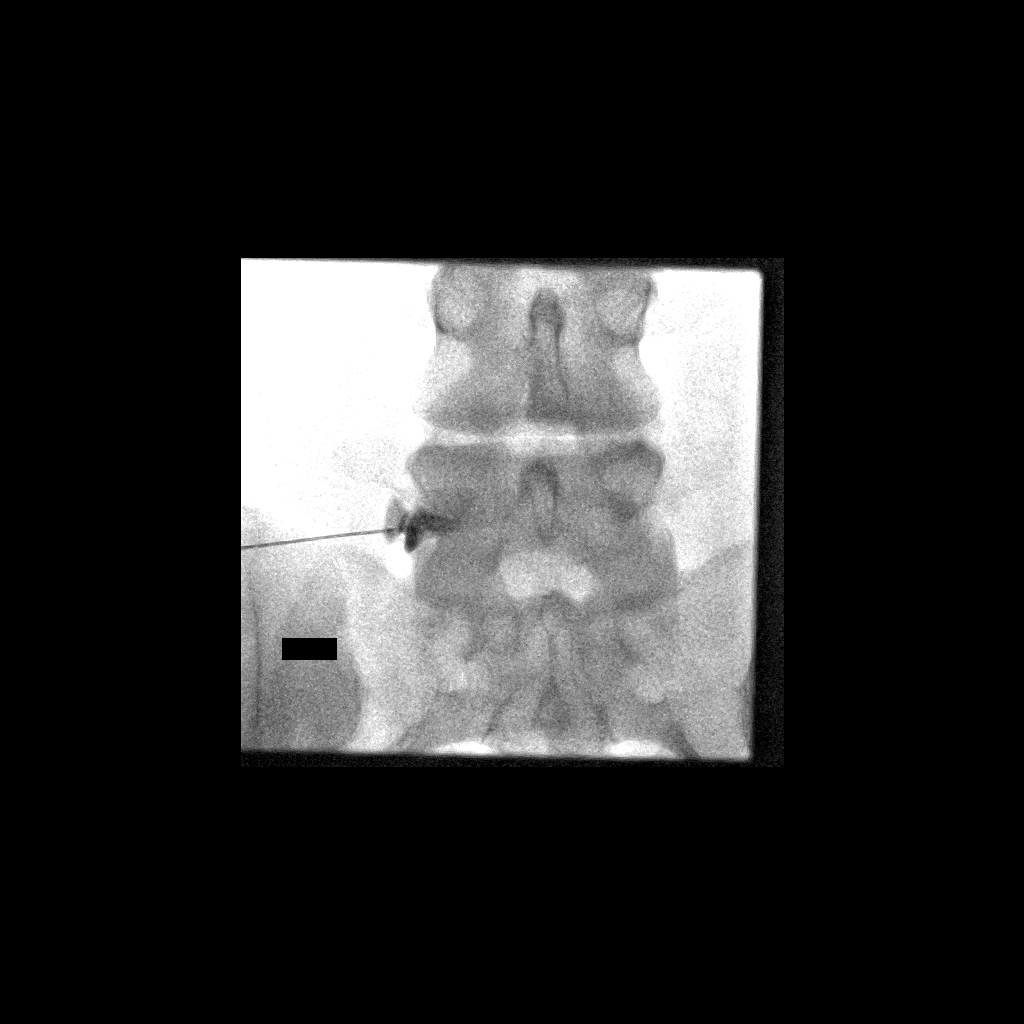

[3 of 3 positions shown; findings below may reference images not displayed]

IMPRESSION: 1. Technically successful left L5-S1 selective nerve root block and
transforaminal epidural steroid injection

## 2018-12-04 ENCOUNTER — Encounter: Payer: Medicaid Other | Admitting: Family Medicine

## 2019-10-06 ENCOUNTER — Other Ambulatory Visit: Payer: Self-pay

## 2019-10-06 ENCOUNTER — Emergency Department (HOSPITAL_COMMUNITY)
Admission: EM | Admit: 2019-10-06 | Discharge: 2019-10-06 | Disposition: A | Discharge status: Home / Self Care | Payer: Medicaid Other | Attending: Emergency Medicine | Admitting: Emergency Medicine

## 2019-10-06 ENCOUNTER — Encounter (HOSPITAL_COMMUNITY): Payer: Self-pay | Admitting: Emergency Medicine

## 2019-10-06 DIAGNOSIS — Y939 Activity, unspecified: Secondary | ICD-10-CM | POA: Insufficient documentation

## 2019-10-06 DIAGNOSIS — Y929 Unspecified place or not applicable: Secondary | ICD-10-CM | POA: Insufficient documentation

## 2019-10-06 DIAGNOSIS — Y33XXXA Other specified events, undetermined intent, initial encounter: Secondary | ICD-10-CM | POA: Insufficient documentation

## 2019-10-06 DIAGNOSIS — Z87891 Personal history of nicotine dependence: Secondary | ICD-10-CM | POA: Insufficient documentation

## 2019-10-06 DIAGNOSIS — J45909 Unspecified asthma, uncomplicated: Secondary | ICD-10-CM | POA: Insufficient documentation

## 2019-10-06 DIAGNOSIS — Z79899 Other long term (current) drug therapy: Secondary | ICD-10-CM | POA: Insufficient documentation

## 2019-10-06 DIAGNOSIS — Y999 Unspecified external cause status: Secondary | ICD-10-CM | POA: Insufficient documentation

## 2019-10-06 DIAGNOSIS — S39012A Strain of muscle, fascia and tendon of lower back, initial encounter: Secondary | ICD-10-CM | POA: Insufficient documentation

## 2019-10-06 MED ORDER — CYCLOBENZAPRINE HCL 10 MG PO TABS: 10.0000 mg | ORAL_TABLET | Freq: Two times a day (BID) | ORAL | 0 refills | Status: DC | PRN

## 2019-10-06 MED ORDER — IBUPROFEN 600 MG PO TABS: 600.0000 mg | ORAL_TABLET | Freq: Four times a day (QID) | ORAL | 0 refills | Status: DC | PRN

## 2019-10-06 NOTE — ED Provider Notes (Signed)
[facility]-EMERGENCY DEPT Provider Note   CSN: [id number] Arrival date & time: 10/06/19  1704     History Chief Complaint  Patient presents with  . Back Pain    Sarah Hatfield is a 24 y.o. female.  The history is provided by the patient. No language interpreter was used.  Back Pain Associated symptoms: no fever and no numbness       24 year old morbidly obese female presenting for evaluation of back pain.  Patient reports she has had prior lumbar laminectomy and decompression microdiscectomy in 2017 for recurrent chronic back pain.  Since the surgery, she still endorse occasional episodes of back pain.  3 days ago she was at work, lifting up a box and felt sharp pain to her low back radiates to left lower back.  Pain is sharp, throbbing, 6 out of 10, intermittent, worsening with movement.  Pain felt similar to prior pain that she has in the past.  She is here only requesting work note to return to work.  She has taken warm bath with some improvement.  She denies any fever, chills, abdominal pain, dysuria, hematuria, bowel bladder incontinence or saddle anesthesia.  No history of active cancer or IV drug use.   Past Medical History:  Diagnosis Date  . Asthma    rare use of inhaler, last time couple of yrs. ago  . Family history of adverse reaction to anesthesia    sister- wakes up during surgery    Patient Active Problem List   Diagnosis Date Noted  . Asthma exacerbation 12/21/2016  . Adjustment disorder with depressed mood 04/17/2016  . S/P lumbar laminectomy 10/23/2015    Past Surgical History:  Procedure Laterality Date  . BACK SURGERY    . IR EPIDUROGRAPHY  05/15/2018  . IR INJECT/THERA/INC NEEDLE/CATH/PLC EPI/LUMB/SAC W/IMG  01/11/2018  . IR INJECT/THERA/INC NEEDLE/CATH/PLC EPI/LUMB/SAC W/IMG  02/21/2018  . LUMBAR LAMINECTOMY/DECOMPRESSION MICRODISCECTOMY Left 10/23/2015   Procedure: LUMBAR LAMINECTOMY/DECOMPRESSION MICRODISCECTOMY LUMBAR  FOUR-FIVE;  Surgeon: Tia Alert, MD;  Location: [facility];  Service: Neurosurgery;  Laterality: Left;  . UPPER GI ENDOSCOPY     done as a child, told wnl     OB History   No obstetric history on file.     Family History  Problem Relation Age of Onset  . Cancer Mother   . Asthma Mother   . Diabetes Other   . Hypertension Other     Social History   Tobacco Use  . Smoking status: Former Games developer  . Smokeless tobacco: Never Used  Substance Use Topics  . Alcohol use: Yes    Comment: social, occasionally   . Drug use: No    Home Medications Prior to Admission medications   Medication Sig Start Date End Date Taking? Authorizing Provider  albuterol (PROVENTIL HFA;VENTOLIN HFA) 108 (90 Base) MCG/ACT inhaler Inhale 2 puffs into the lungs every 4 (four) hours as needed for wheezing or shortness of breath. 12/22/16   Rhetta Mura, MD  benzonatate (TESSALON) 100 MG capsule Take 2 capsules (200 mg total) by mouth 2 (two) times daily as needed for cough. 12/22/16   Rhetta Mura, MD  budesonide (PULMICORT) 180 MCG/ACT inhaler Inhale 2 puffs into the lungs 2 (two) times daily. 12/22/16   Rhetta Mura, MD  HYDROcodone-acetaminophen (NORCO/VICODIN) 5-325 MG tablet Take 2 tablets by mouth every 4 (four) hours as needed. 10/10/17   Elson Areas, PA-C  ibuprofen (ADVIL,MOTRIN) 200 MG tablet Take 400 mg by mouth every 6 (six)  hours as needed for fever, headache, mild pain, moderate pain or cramping.     [provider]  montelukast (SINGULAIR) 10 MG tablet Take 1 tablet (10 mg total) by mouth at bedtime. 12/22/16   Rhetta Mura, MD  predniSONE (DELTASONE) 10 MG tablet 6,5,4,3,2,1 taper 10/10/17   Elson Areas, PA-C    Allergies    Patient has no known allergies.  Review of Systems   Review of Systems  Constitutional: Negative for fever.  Musculoskeletal: Positive for back pain.  Neurological: Negative for numbness.    Physical Exam Updated  Vital Signs BP (!) 142/105   Pulse 98   Temp 99.7 F (37.6 C) (Oral)   Resp 18   LMP 09/16/2019   SpO2 100%   Physical Exam Vitals and nursing note reviewed.  Constitutional:      General: She is not in acute distress.    Appearance: She is well-developed. She is obese.  HENT:     Head: Atraumatic.  Eyes:     Conjunctiva/sclera: Conjunctivae normal.  Abdominal:     Palpations: Abdomen is soft.  Musculoskeletal:        General: Tenderness (Tenderness to lumbar and left lumbar paraspinal muscle on palpation with normal back range of motion and normal gait.) and deformity present.     Cervical back: Neck supple.     Comments: Negative L straight leg raise  Skin:    Findings: No rash.  Neurological:     Mental Status: She is alert.  Psychiatric:        Mood and Affect: Mood normal.     ED Results / Procedures / Treatments   Labs (all labs ordered are listed, but only abnormal results are displayed) Labs Reviewed - No data to display  EKG None  Radiology No results found.  Procedures Procedures (including critical care time)  Medications Ordered in ED Medications - No data to display  ED Course  I have reviewed the triage vital signs and the nursing notes.  Pertinent labs & imaging results that were available during my care of the patient were reviewed by me and considered in my medical decision making (see chart for details).    MDM Rules/Calculators/A&P                      BP (!) 142/105   Pulse 98   Temp 99.7 F (37.6 C) (Oral)   Resp 18   LMP 09/16/2019   SpO2 100%   Final Clinical Impression(s) / ED Diagnoses Final diagnoses:  Strain of lumbar region, initial encounter    Rx / DC Orders ED Discharge Orders         Ordered    ibuprofen (ADVIL) 600 MG tablet  Every 6 hours PRN     10/06/19 1749    cyclobenzaprine (FLEXERIL) 10 MG tablet  2 times daily PRN     10/06/19 1749         No red flag s/s of low back pain. Patient was counseled  on back pain precautions and told to do activity as tolerated but do not lift, push, or pull heavy objects more than 10 pounds for the next week. Patient counseled to use ice or heat on back for no longer than 15 minutes every hour.   Patient prescribed muscle relaxer and counseled on proper use of muscle relaxant medication.   Patient urged to follow-up with PCP if pain does not improve with treatment and rest or  if pain becomes recurrent. Urged to return with worsening severe pain, loss of bowel or bladder control, trouble walking.  The patient verbalizes understanding and agrees with the plan.    Domenic Moras, PA-C 10/06/19 1749    Maudie Flakes, MD 10/07/19 [phone]

## 2019-10-06 NOTE — ED Notes (Signed)
An After Visit Summary was printed and given to the patient. Discharge instructions given and no further questions at this time.  

## 2019-10-06 NOTE — ED Triage Notes (Signed)
C/o lower back pains x 4 days. Denies any new injuries, lifting or falls to cause pains. Reports had back surgery years ago

## 2020-03-14 ENCOUNTER — Emergency Department (HOSPITAL_COMMUNITY)
Admission: EM | Admit: 2020-03-14 | Discharge: 2020-03-14 | Disposition: A | Discharge status: Home / Self Care | Payer: Self-pay | Attending: Emergency Medicine | Admitting: Emergency Medicine

## 2020-03-14 ENCOUNTER — Encounter (HOSPITAL_COMMUNITY): Payer: Self-pay | Admitting: *Deleted

## 2020-03-14 ENCOUNTER — Other Ambulatory Visit: Payer: Self-pay

## 2020-03-14 DIAGNOSIS — Z79899 Other long term (current) drug therapy: Secondary | ICD-10-CM | POA: Insufficient documentation

## 2020-03-14 DIAGNOSIS — J45901 Unspecified asthma with (acute) exacerbation: Secondary | ICD-10-CM | POA: Insufficient documentation

## 2020-03-14 DIAGNOSIS — R590 Localized enlarged lymph nodes: Secondary | ICD-10-CM | POA: Insufficient documentation

## 2020-03-14 LAB — BASIC METABOLIC PANEL
Anion gap: 7 (ref 5–15)
BUN: 9 mg/dL (ref 6–20)
CO2: 26 mmol/L (ref 22–32)
Calcium: 9.1 mg/dL (ref 8.9–10.3)
Chloride: 105 mmol/L (ref 98–111)
Creatinine, Ser: 0.66 mg/dL (ref 0.44–1.00)
GFR calc Af Amer: >60 mL/min (ref >60)
GFR calc non Af Amer: >60 mL/min (ref >60)
Glucose, Bld: 91 mg/dL (ref 70–99)
Potassium: 4.2 mmol/L (ref 3.5–5.1)
Sodium: 138 mmol/L (ref 135–145)

## 2020-03-14 LAB — CBC WITH DIFFERENTIAL/PLATELET
Abs Immature Granulocytes: 0.06 10*3/uL (ref 0.00–0.07)
Basophils Absolute: 0 10*3/uL (ref 0.0–0.1)
Basophils Relative: 0 %
Eosinophils Absolute: 0.1 10*3/uL (ref 0.0–0.5)
Eosinophils Relative: 1 %
HCT: 39.9 % (ref 36.0–46.0)
Hemoglobin: 12.9 g/dL (ref 12.0–15.0)
Immature Granulocytes: 0 %
Lymphocytes Relative: 17 %
Lymphs Abs: 2.5 10*3/uL (ref 0.7–4.0)
MCH: 29.6 pg (ref 26.0–34.0)
MCHC: 32.3 g/dL (ref 30.0–36.0)
MCV: 91.5 fL (ref 80.0–100.0)
Monocytes Absolute: 1 10*3/uL (ref 0.1–1.0)
Monocytes Relative: 6 %
Neutro Abs: 11.2 10*3/uL — ABNORMAL HIGH (ref 1.7–7.7)
Neutrophils Relative %: 76 %
Platelets: 278 10*3/uL (ref 150–400)
RBC: 4.36 MIL/uL (ref 3.87–5.11)
RDW: 13.5 % (ref 11.5–15.5)
WBC: 14.9 10*3/uL — ABNORMAL HIGH (ref 4.0–10.5)
nRBC: 0 % (ref 0.0–0.2)

## 2020-03-14 MED ORDER — DOXYCYCLINE HYCLATE 100 MG PO CAPS: 100.0000 mg | ORAL_CAPSULE | Freq: Two times a day (BID) | ORAL | 0 refills | Status: AC

## 2020-03-14 NOTE — ED Triage Notes (Signed)
Reports "lumps" on both side of neck for 5 days

## 2020-03-14 NOTE — ED Provider Notes (Signed)
[facility] DEPT Provider Note   CSN: [id number] Arrival date & time: 03/14/20  8185     History Chief Complaint  Patient presents with  . Lumps on neck    Sarah Hatfield is a 24 y.o. female.  24 year old female presents with cervical lymphadenopathy.  Denies any recent URI symptoms.  No trouble swallowing or breathing.  No recent history of weight loss.  Swelling is more pronounced to her on the left side of her neck.  No redness to her throat appreciated.  No treatment use prior to arrival        Past Medical History:  Diagnosis Date  . Asthma    rare use of inhaler, last time couple of yrs. ago  . Family history of adverse reaction to anesthesia    sister- wakes up during surgery    Patient Active Problem List   Diagnosis Date Noted  . Asthma exacerbation 12/21/2016  . Adjustment disorder with depressed mood 04/17/2016  . S/P lumbar laminectomy 10/23/2015    Past Surgical History:  Procedure Laterality Date  . BACK SURGERY    . IR EPIDUROGRAPHY  05/15/2018  . IR INJECT/THERA/INC NEEDLE/CATH/PLC EPI/LUMB/SAC W/IMG  01/11/2018  . IR INJECT/THERA/INC NEEDLE/CATH/PLC EPI/LUMB/SAC W/IMG  02/21/2018  . LUMBAR LAMINECTOMY/DECOMPRESSION MICRODISCECTOMY Left 10/23/2015   Procedure: LUMBAR LAMINECTOMY/DECOMPRESSION MICRODISCECTOMY LUMBAR FOUR-FIVE;  Surgeon: Eustace Moore, MD;  Location: [facility];  Service: Neurosurgery;  Laterality: Left;  . UPPER GI ENDOSCOPY     done as a child, told wnl     OB History   No obstetric history on file.     Family History  Problem Relation Age of Onset  . Cancer Mother   . Asthma Mother   . Diabetes Other   . Hypertension Other     Social History   Tobacco Use  . Smoking status: Former Research scientist (life sciences)  . Smokeless tobacco: Never Used  Vaping Use  . Vaping Use: Never used  Substance Use Topics  . Alcohol use: Yes    Comment: social, occasionally   . Drug use: No    Home Medications Prior to  Admission medications   Medication Sig Start Date End Date Taking? Authorizing Provider  albuterol (PROVENTIL HFA;VENTOLIN HFA) 108 (90 Base) MCG/ACT inhaler Inhale 2 puffs into the lungs every 4 (four) hours as needed for wheezing or shortness of breath. 12/22/16   Nita Sells, MD  benzonatate (TESSALON) 100 MG capsule Take 2 capsules (200 mg total) by mouth 2 (two) times daily as needed for cough. 12/22/16   Nita Sells, MD  budesonide (PULMICORT) 180 MCG/ACT inhaler Inhale 2 puffs into the lungs 2 (two) times daily. 12/22/16   Nita Sells, MD  cyclobenzaprine (FLEXERIL) 10 MG tablet Take 1 tablet (10 mg total) by mouth 2 (two) times daily as needed for muscle spasms. 10/06/19   Domenic Moras, PA-C  HYDROcodone-acetaminophen (NORCO/VICODIN) 5-325 MG tablet Take 2 tablets by mouth every 4 (four) hours as needed. 10/10/17   Fransico Meadow, PA-C  ibuprofen (ADVIL) 600 MG tablet Take 1 tablet (600 mg total) by mouth every 6 (six) hours as needed. 10/06/19   Domenic Moras, PA-C  montelukast (SINGULAIR) 10 MG tablet Take 1 tablet (10 mg total) by mouth at bedtime. 12/22/16   Nita Sells, MD  predniSONE (DELTASONE) 10 MG tablet 6,5,4,3,2,1 taper 10/10/17   Fransico Meadow, PA-C    Allergies    Patient has no known allergies.  Review of Systems   Review of Systems  All other systems reviewed and are negative.   Physical Exam Updated Vital Signs BP (!) 153/120 (BP Location: Left Arm)   Pulse (!) 103   Temp 99.1 F (37.3 C) (Oral)   Resp 17   Ht 1.854 m (6\' 1" )   Wt (!) 166.5 kg   SpO2 100%   BMI 48.42 kg/m   Physical Exam Vitals and nursing note reviewed.  Constitutional:      General: She is not in acute distress.    Appearance: Normal appearance. She is well-developed. She is not toxic-appearing.  HENT:     Head: Normocephalic and atraumatic.  Eyes:     General: Lids are normal.     Conjunctiva/sclera: Conjunctivae normal.     Pupils: Pupils are equal,  round, and reactive to light.  Neck:     Thyroid: No thyroid mass or thyroid tenderness.     Trachea: No tracheal deviation.  Cardiovascular:     Rate and Rhythm: Normal rate and regular rhythm.     Heart sounds: Normal heart sounds. No murmur heard.  No gallop.   Pulmonary:     Effort: Pulmonary effort is normal. No respiratory distress.     Breath sounds: Normal breath sounds. No stridor. No decreased breath sounds, wheezing, rhonchi or rales.  Abdominal:     General: Bowel sounds are normal. There is no distension.     Palpations: Abdomen is soft.     Tenderness: There is no abdominal tenderness. There is no rebound.  Musculoskeletal:        General: No tenderness. Normal range of motion.     Cervical back: Normal range of motion and neck supple.  Lymphadenopathy:     Cervical: Cervical adenopathy present.  Skin:    General: Skin is warm and dry.     Findings: No abrasion or rash.  Neurological:     Mental Status: She is alert and oriented to person, place, and time.     GCS: GCS eye subscore is 4. GCS verbal subscore is 5. GCS motor subscore is 6.     Cranial Nerves: No cranial nerve deficit.     Sensory: No sensory deficit.  Psychiatric:        Speech: Speech normal.        Behavior: Behavior normal.     ED Results / Procedures / Treatments   Labs (all labs ordered are listed, but only abnormal results are displayed) Labs Reviewed  CBC WITH DIFFERENTIAL/PLATELET  BASIC METABOLIC PANEL    EKG None  Radiology No results found.  Procedures Procedures (including critical care time)  Medications Ordered in ED Medications - No data to display  ED Course  I have reviewed the triage vital signs and the nursing notes.  Pertinent labs & imaging results that were available during my care of the patient were reviewed by me and considered in my medical decision making (see chart for details).    MDM Rules/Calculators/A&P                         Mild elevated WBC  count. We will place patient on doxycycline and give return precautions Final Clinical Impression(s) / ED Diagnoses Final diagnoses:  None    Rx / DC Orders ED Discharge Orders    None       , MD 03/14/20 1154

## 2020-06-18 DIAGNOSIS — Z01419 Encounter for gynecological examination (general) (routine) without abnormal findings: Secondary | ICD-10-CM | POA: Diagnosis not present

## 2020-06-18 DIAGNOSIS — Z124 Encounter for screening for malignant neoplasm of cervix: Secondary | ICD-10-CM | POA: Diagnosis not present

## 2020-06-18 DIAGNOSIS — Z113 Encounter for screening for infections with a predominantly sexual mode of transmission: Secondary | ICD-10-CM | POA: Diagnosis not present

## 2021-02-26 ENCOUNTER — Emergency Department (HOSPITAL_COMMUNITY): Payer: Medicaid Other

## 2021-02-26 ENCOUNTER — Other Ambulatory Visit: Payer: Self-pay

## 2021-02-26 ENCOUNTER — Emergency Department (HOSPITAL_COMMUNITY)
Admission: EM | Admit: 2021-02-26 | Discharge: 2021-02-27 | Disposition: A | Discharge status: Home / Self Care | Payer: Medicaid Other | Attending: Emergency Medicine | Admitting: Emergency Medicine

## 2021-02-26 ENCOUNTER — Encounter (HOSPITAL_COMMUNITY): Payer: Self-pay | Admitting: *Deleted

## 2021-02-26 DIAGNOSIS — Z87891 Personal history of nicotine dependence: Secondary | ICD-10-CM | POA: Diagnosis not present

## 2021-02-26 DIAGNOSIS — O26891 Other specified pregnancy related conditions, first trimester: Secondary | ICD-10-CM | POA: Diagnosis not present

## 2021-02-26 DIAGNOSIS — J029 Acute pharyngitis, unspecified: Secondary | ICD-10-CM | POA: Insufficient documentation

## 2021-02-26 DIAGNOSIS — Z3A01 Less than 8 weeks gestation of pregnancy: Secondary | ICD-10-CM | POA: Diagnosis not present

## 2021-02-26 DIAGNOSIS — R059 Cough, unspecified: Secondary | ICD-10-CM | POA: Insufficient documentation

## 2021-02-26 DIAGNOSIS — J45909 Unspecified asthma, uncomplicated: Secondary | ICD-10-CM | POA: Diagnosis not present

## 2021-02-26 DIAGNOSIS — R1030 Lower abdominal pain, unspecified: Secondary | ICD-10-CM | POA: Insufficient documentation

## 2021-02-26 DIAGNOSIS — Z20822 Contact with and (suspected) exposure to covid-19: Secondary | ICD-10-CM | POA: Insufficient documentation

## 2021-02-26 DIAGNOSIS — O99511 Diseases of the respiratory system complicating pregnancy, first trimester: Secondary | ICD-10-CM | POA: Insufficient documentation

## 2021-02-26 DIAGNOSIS — R109 Unspecified abdominal pain: Secondary | ICD-10-CM | POA: Diagnosis not present

## 2021-02-26 HISTORY — DX: Polycystic ovarian syndrome: E28.2

## 2021-02-26 LAB — CBC
HCT: 40.1 % (ref 36.0–46.0)
Hemoglobin: 13.4 g/dL (ref 12.0–15.0)
MCH: 29.4 pg (ref 26.0–34.0)
MCHC: 33.4 g/dL (ref 30.0–36.0)
MCV: 87.9 fL (ref 80.0–100.0)
Platelets: 271 10*3/uL (ref 150–400)
RBC: 4.56 MIL/uL (ref 3.87–5.11)
RDW: 14 % (ref 11.5–15.5)
WBC: 15.6 10*3/uL — ABNORMAL HIGH (ref 4.0–10.5)
nRBC: 0 % (ref 0.0–0.2)

## 2021-02-26 LAB — COMPREHENSIVE METABOLIC PANEL
ALT: 26 U/L (ref 0–44)
AST: 30 U/L (ref 15–41)
Albumin: 4.2 g/dL (ref 3.5–5.0)
Alkaline Phosphatase: 35 U/L — ABNORMAL LOW (ref 38–126)
Anion gap: 8 (ref 5–15)
BUN: 9 mg/dL (ref 6–20)
CO2: 21 mmol/L — ABNORMAL LOW (ref 22–32)
Calcium: 9.1 mg/dL (ref 8.9–10.3)
Chloride: 106 mmol/L (ref 98–111)
Creatinine, Ser: 0.74 mg/dL (ref 0.44–1.00)
GFR, Estimated: >60 mL/min (ref >60)
Glucose, Bld: 108 mg/dL — ABNORMAL HIGH (ref 70–99)
Potassium: 3.9 mmol/L (ref 3.5–5.1)
Sodium: 135 mmol/L (ref 135–145)
Total Bilirubin: 0.7 mg/dL (ref 0.3–1.2)
Total Protein: 7.4 g/dL (ref 6.5–8.1)

## 2021-02-26 LAB — URINALYSIS, ROUTINE W REFLEX MICROSCOPIC
Bilirubin Urine: NEGATIVE
Glucose, UA: NEGATIVE mg/dL
Hgb urine dipstick: NEGATIVE
Ketones, ur: 5 mg/dL — AB
Leukocytes,Ua: NEGATIVE
Nitrite: NEGATIVE
Protein, ur: NEGATIVE mg/dL
Specific Gravity, Urine: 1.03 (ref 1.005–1.030)
pH: 6 (ref 5.0–8.0)

## 2021-02-26 LAB — I-STAT BETA HCG BLOOD, ED (MC, WL, AP ONLY): I-stat hCG, quantitative: >2000 m[IU]/mL — ABNORMAL HIGH (ref <5)

## 2021-02-26 LAB — LIPASE, BLOOD: Lipase: 22 U/L (ref 11–51)

## 2021-02-26 NOTE — ED Provider Notes (Signed)
[facility]-EMERGENCY DEPT Provider Note   CSN: [id number] Arrival date & time: 02/26/21  2148     History Chief Complaint  Patient presents with  . Abdominal Pain  . URI    Sarah Hatfield is a 25 y.o. female.  The history is provided by the patient and medical records.  Abdominal Pain Associated symptoms: cough and sore throat   URI Presenting symptoms: cough and sore throat     25 year old female with history of asthma, PCOS, presenting to the ED with abdominal pain.  States she has been having this same pain since May, does not seem to be getting any worse but also not getting any better.  She states it feels like just steady menstrual cramps.  This is not super unusual for her as she has PCOS so always has some degree of discomfort.  She has not had menstrual cycle since April, however this is also not abnormal for her as she tends to skip 3 to 4 months at a time between cycles.  She did have a normal cycle then that was approximately 5 days.  She has not had any urinary symptoms or vaginal discharge.  She is sexually active with boyfriend whom she lives with.  Patient also recently with some URI type symptoms.  She did have a rapid COVID test at work that was negative.  She reports some mild throat discomfort and a dry cough.  She has not had any fevers or chills.  No body aches.  She has not had any known sick contacts or COVID exposures.  Past Medical History:  Diagnosis Date  . Asthma    rare use of inhaler, last time couple of yrs. ago  . Family history of adverse reaction to anesthesia    sister- wakes up during surgery  . PCOS (polycystic ovarian syndrome)     Patient Active Problem List   Diagnosis Date Noted  . Asthma exacerbation 12/21/2016  . Adjustment disorder with depressed mood 04/17/2016  . S/P lumbar laminectomy 10/23/2015    Past Surgical History:  Procedure Laterality Date  . BACK SURGERY    . IR EPIDUROGRAPHY  05/15/2018  .  IR INJECT/THERA/INC NEEDLE/CATH/PLC EPI/LUMB/SAC W/IMG  01/11/2018  . IR INJECT/THERA/INC NEEDLE/CATH/PLC EPI/LUMB/SAC W/IMG  02/21/2018  . LUMBAR LAMINECTOMY/DECOMPRESSION MICRODISCECTOMY Left 10/23/2015   Procedure: LUMBAR LAMINECTOMY/DECOMPRESSION MICRODISCECTOMY LUMBAR FOUR-FIVE;  Surgeon: Tia Alert, MD;  Location: [facility];  Service: Neurosurgery;  Laterality: Left;  . UPPER GI ENDOSCOPY     done as a child, told wnl     OB History   No obstetric history on file.     Family History  Problem Relation Age of Onset  . Cancer Mother   . Asthma Mother   . Diabetes Other   . Hypertension Other     Social History   Tobacco Use  . Smoking status: Former Games developer  . Smokeless tobacco: Never Used  Vaping Use  . Vaping Use: Never used  Substance Use Topics  . Alcohol use: Yes    Comment: social, occasionally   . Drug use: No    Home Medications Prior to Admission medications   Medication Sig Start Date End Date Taking? Authorizing Provider  doxycycline (VIBRAMYCIN) 100 MG capsule Take 1 capsule (100 mg total) by mouth 2 (two) times daily. 03/14/20   Lorre Nick, MD  ibuprofen (ADVIL) 200 MG tablet Take 200-800 mg by mouth daily as needed for fever, headache, moderate pain or cramping.  [provider]    Allergies    Patient has no known allergies.  Review of Systems   Review of Systems  HENT: Positive for sore throat.   Respiratory: Positive for cough.   Gastrointestinal: Positive for abdominal pain.  All other systems reviewed and are negative.   Physical Exam Updated Vital Signs BP (!) 149/106 (BP Location: Left Arm)   Pulse (!) 112   Temp 99 F (37.2 C) (Oral)   Resp 20   Wt (!) 166.5 kg   LMP 12/31/2020 (Exact Date)   SpO2 100%   BMI 48.42 kg/m   Physical Exam Vitals and nursing note reviewed.  Constitutional:      Appearance: She is well-developed.  HENT:     Head: Normocephalic and atraumatic.     Mouth/Throat:     Comments:  Some PND noted posterior oropharynx, no tonsillar edema or exudates. Handling secretions well, normal phonation, no stridor Eyes:     Conjunctiva/sclera: Conjunctivae normal.     Pupils: Pupils are equal, round, and reactive to light.  Cardiovascular:     Rate and Rhythm: Normal rate and regular rhythm.     Heart sounds: Normal heart sounds.  Pulmonary:     Effort: Pulmonary effort is normal.     Breath sounds: Normal breath sounds.  Abdominal:     General: Bowel sounds are normal.     Palpations: Abdomen is soft.     Tenderness: There is no abdominal tenderness. There is no rebound.  Musculoskeletal:        General: Normal range of motion.     Cervical back: Normal range of motion.  Skin:    General: Skin is warm and dry.  Neurological:     Mental Status: She is alert and oriented to person, place, and time.     ED Results / Procedures / Treatments   Labs (all labs ordered are listed, but only abnormal results are displayed) Labs Reviewed  COMPREHENSIVE METABOLIC PANEL - Abnormal; Notable for the following components:      Result Value   CO2 21 (*)    Glucose, Bld 108 (*)    Alkaline Phosphatase 35 (*)    All other components within normal limits  CBC - Abnormal; Notable for the following components:   WBC 15.6 (*)    All other components within normal limits  URINALYSIS, ROUTINE W REFLEX MICROSCOPIC - Abnormal; Notable for the following components:   Ketones, ur 5 (*)    All other components within normal limits  HCG, QUANTITATIVE, PREGNANCY - Abnormal; Notable for the following components:   hCG, Beta Chain, Quant, S 32,977 (*)    All other components within normal limits  I-STAT BETA HCG BLOOD, ED ([facility], [facility], [facility] ONLY) - Abnormal; Notable for the following components:   I-stat hCG, quantitative >2,000.0 (*)    All other components within normal limits  SARS CORONAVIRUS 2 (TAT 6-24 HRS)  LIPASE, BLOOD    EKG None  Radiology US OB Comp Less 14 Wks  Result Date:  02/26/2021 CLINICAL DATA:  Viability, dating EXAM: OBSTETRIC <14 WK ULTRASOUND TECHNIQUE: Transabdominal ultrasound was performed for evaluation of the gestation as well as the maternal uterus and adnexal regions. COMPARISON:  None. FINDINGS: Intrauterine gestational sac: Single Yolk sac:  Visualized. Embryo:  Visualized. Cardiac Activity: Visualized. Heart Rate: 162 bpm MSD:    mm    w     d CRL:   9.9 mm   7 w 1 d  [place] EDC: 10/14/2021 Subchorionic hemorrhage:  None visualized. Maternal uterus/adnexae: No adnexal mass or free fluid IMPRESSION: Seven week 1 day intrauterine pregnancy. Fetal heart rate 162 beats per minute. No acute maternal findings. Electronically Signed   By: Charlett Nose M.D.   On: 02/26/2021 23:36    Procedures Procedures   Medications Ordered in ED Medications - No data to display  ED Course  I have reviewed the triage vital signs and the nursing notes.  Pertinent labs & imaging results that were available during my care of the patient were reviewed by me and considered in my medical decision making (see chart for details).    MDM Rules/Calculators/A&P  25 year old female here with ongoing lower abdominal discomfort for the past month.  She describes this as like menstrual cramps.  She is found to be pregnant today.  This is first pregnancy.  She has not had any irregular bleeding or discharge.  Labs today are overall reassuring.  Ultrasound was obtained revealing [redacted]w[redacted]d fetus without complications.  She will need to arrange follow-up with her OB/GYN.  Will start on prenatals.  In regards to recent URI symptoms, no significant findings on exam and lungs CTAB. She had negative point-of-care covid test but will send PCR.  She will be notified if abnormal.  Return here for [facility]U for ongoing concerns.  Final Clinical Impression(s) / ED Diagnoses Final diagnoses:  Less than [redacted] weeks gestation of pregnancy    Rx / DC Orders ED Discharge Orders         Ordered     Prenatal Vit-Fe Fumarate-FA (PRENATAL COMPLETE) 14-0.4 MG TABS  Daily        02/27/21 0006           Garlon Hatchet, PA-C 02/27/21 0025    Sabino Donovan, MD 02/27/21 1451

## 2021-02-26 NOTE — ED Triage Notes (Signed)
Pt is here due to abdominal pain since may.  Pt reports that the abdominal pain feels like "period cramps".  LMP April 2022 and pt states that this is normal for her as she has PCOS.  Pt also has sore throat and cough (URI).

## 2021-02-27 LAB — HCG, QUANTITATIVE, PREGNANCY: hCG, Beta Chain, Quant, S: 32977 m[IU]/mL — ABNORMAL HIGH (ref <5)

## 2021-02-27 LAB — SARS CORONAVIRUS 2 (TAT 6-24 HRS): SARS Coronavirus 2: NEGATIVE

## 2021-02-27 MED ORDER — PRENATAL COMPLETE 14-0.4 MG PO TABS: 1.0000 | ORAL_TABLET | Freq: Every day | ORAL | 0 refills | Status: AC

## 2021-02-27 NOTE — Discharge Instructions (Signed)
Please call your OB-GYN in the morning to set up ongoing prenatal care. Prenatal vitamins sent to pharmacy for you.  Take as directed. You will be notified if covid test is positive. Return here or MAU at  (Entrance C) for pregnancy related complaints.

## 2021-03-17 DIAGNOSIS — O200 Threatened abortion: Secondary | ICD-10-CM | POA: Diagnosis not present

## 2021-03-17 DIAGNOSIS — R300 Dysuria: Secondary | ICD-10-CM | POA: Diagnosis not present

## 2021-03-17 DIAGNOSIS — Z3201 Encounter for pregnancy test, result positive: Secondary | ICD-10-CM | POA: Diagnosis not present

## 2021-03-17 DIAGNOSIS — O26891 Other specified pregnancy related conditions, first trimester: Secondary | ICD-10-CM | POA: Diagnosis not present

## 2021-03-17 DIAGNOSIS — R102 Pelvic and perineal pain: Secondary | ICD-10-CM | POA: Diagnosis not present

## 2021-03-17 DIAGNOSIS — Z3A10 10 weeks gestation of pregnancy: Secondary | ICD-10-CM | POA: Diagnosis not present

## 2021-04-18 DIAGNOSIS — R197 Diarrhea, unspecified: Secondary | ICD-10-CM | POA: Diagnosis not present

## 2021-04-18 DIAGNOSIS — R0981 Nasal congestion: Secondary | ICD-10-CM | POA: Diagnosis not present

## 2021-04-18 DIAGNOSIS — R638 Other symptoms and signs concerning food and fluid intake: Secondary | ICD-10-CM | POA: Diagnosis not present

## 2021-04-18 DIAGNOSIS — R112 Nausea with vomiting, unspecified: Secondary | ICD-10-CM | POA: Diagnosis not present

## 2021-04-21 DIAGNOSIS — Z3A15 15 weeks gestation of pregnancy: Secondary | ICD-10-CM | POA: Diagnosis not present

## 2021-04-21 DIAGNOSIS — Z3689 Encounter for other specified antenatal screening: Secondary | ICD-10-CM | POA: Diagnosis not present

## 2021-04-21 DIAGNOSIS — Z3482 Encounter for supervision of other normal pregnancy, second trimester: Secondary | ICD-10-CM | POA: Diagnosis not present

## 2021-04-21 DIAGNOSIS — Z3A16 16 weeks gestation of pregnancy: Secondary | ICD-10-CM | POA: Diagnosis not present

## 2021-04-21 DIAGNOSIS — O0932 Supervision of pregnancy with insufficient antenatal care, second trimester: Secondary | ICD-10-CM | POA: Diagnosis not present

## 2021-04-21 DIAGNOSIS — O99212 Obesity complicating pregnancy, second trimester: Secondary | ICD-10-CM | POA: Diagnosis not present

## 2021-04-21 DIAGNOSIS — E669 Obesity, unspecified: Secondary | ICD-10-CM | POA: Diagnosis not present

## 2021-04-21 DIAGNOSIS — O3680X0 Pregnancy with inconclusive fetal viability, not applicable or unspecified: Secondary | ICD-10-CM | POA: Diagnosis not present

## 2021-05-18 DIAGNOSIS — Z315 Encounter for genetic counseling: Secondary | ICD-10-CM | POA: Diagnosis not present

## 2021-05-18 DIAGNOSIS — Z3A19 19 weeks gestation of pregnancy: Secondary | ICD-10-CM | POA: Diagnosis not present

## 2021-05-18 DIAGNOSIS — O280 Abnormal hematological finding on antenatal screening of mother: Secondary | ICD-10-CM | POA: Diagnosis not present

## 2021-05-19 DIAGNOSIS — Z363 Encounter for antenatal screening for malformations: Secondary | ICD-10-CM | POA: Diagnosis not present

## 2021-05-19 DIAGNOSIS — Z3689 Encounter for other specified antenatal screening: Secondary | ICD-10-CM | POA: Diagnosis not present

## 2021-05-19 DIAGNOSIS — Z3A19 19 weeks gestation of pregnancy: Secondary | ICD-10-CM | POA: Diagnosis not present

## 2021-06-02 DIAGNOSIS — R202 Paresthesia of skin: Secondary | ICD-10-CM | POA: Diagnosis not present

## 2021-06-02 DIAGNOSIS — R200 Anesthesia of skin: Secondary | ICD-10-CM | POA: Diagnosis not present

## 2021-06-02 DIAGNOSIS — R29898 Other symptoms and signs involving the musculoskeletal system: Secondary | ICD-10-CM | POA: Diagnosis not present

## 2021-06-16 DIAGNOSIS — Z362 Encounter for other antenatal screening follow-up: Secondary | ICD-10-CM | POA: Diagnosis not present

## 2021-06-16 DIAGNOSIS — Z3A23 23 weeks gestation of pregnancy: Secondary | ICD-10-CM | POA: Diagnosis not present

## 2021-06-16 DIAGNOSIS — O99212 Obesity complicating pregnancy, second trimester: Secondary | ICD-10-CM | POA: Diagnosis not present

## 2021-06-16 DIAGNOSIS — O162 Unspecified maternal hypertension, second trimester: Secondary | ICD-10-CM | POA: Diagnosis not present

## 2021-07-14 DIAGNOSIS — Z3A28 28 weeks gestation of pregnancy: Secondary | ICD-10-CM | POA: Diagnosis not present

## 2021-07-14 DIAGNOSIS — Z3A27 27 weeks gestation of pregnancy: Secondary | ICD-10-CM | POA: Diagnosis not present

## 2021-07-14 DIAGNOSIS — Z3689 Encounter for other specified antenatal screening: Secondary | ICD-10-CM | POA: Diagnosis not present

## 2021-07-14 DIAGNOSIS — O99212 Obesity complicating pregnancy, second trimester: Secondary | ICD-10-CM | POA: Diagnosis not present

## 2021-07-22 DIAGNOSIS — Z3A23 23 weeks gestation of pregnancy: Secondary | ICD-10-CM | POA: Diagnosis not present

## 2021-07-22 DIAGNOSIS — Z3A29 29 weeks gestation of pregnancy: Secondary | ICD-10-CM | POA: Diagnosis not present

## 2021-07-22 DIAGNOSIS — O99212 Obesity complicating pregnancy, second trimester: Secondary | ICD-10-CM | POA: Diagnosis not present

## 2021-07-28 DIAGNOSIS — Z23 Encounter for immunization: Secondary | ICD-10-CM | POA: Diagnosis not present

## 2021-08-11 DIAGNOSIS — Z3A31 31 weeks gestation of pregnancy: Secondary | ICD-10-CM | POA: Diagnosis not present

## 2021-08-11 DIAGNOSIS — Z3689 Encounter for other specified antenatal screening: Secondary | ICD-10-CM | POA: Diagnosis not present

## 2021-08-11 DIAGNOSIS — O99213 Obesity complicating pregnancy, third trimester: Secondary | ICD-10-CM | POA: Diagnosis not present

## 2021-08-18 DIAGNOSIS — Z23 Encounter for immunization: Secondary | ICD-10-CM | POA: Diagnosis not present

## 2021-08-18 DIAGNOSIS — Z3A32 32 weeks gestation of pregnancy: Secondary | ICD-10-CM | POA: Diagnosis not present

## 2021-08-18 DIAGNOSIS — O99213 Obesity complicating pregnancy, third trimester: Secondary | ICD-10-CM | POA: Diagnosis not present

## 2021-08-31 DIAGNOSIS — Z3493 Encounter for supervision of normal pregnancy, unspecified, third trimester: Secondary | ICD-10-CM | POA: Diagnosis not present

## 2021-09-11 DIAGNOSIS — O99213 Obesity complicating pregnancy, third trimester: Secondary | ICD-10-CM | POA: Diagnosis not present

## 2021-09-18 DIAGNOSIS — Z3A37 37 weeks gestation of pregnancy: Secondary | ICD-10-CM | POA: Diagnosis not present

## 2021-09-18 DIAGNOSIS — O99213 Obesity complicating pregnancy, third trimester: Secondary | ICD-10-CM | POA: Diagnosis not present

## 2021-09-22 DIAGNOSIS — O10913 Unspecified pre-existing hypertension complicating pregnancy, third trimester: Secondary | ICD-10-CM | POA: Diagnosis not present

## 2021-09-30 DIAGNOSIS — Z3A39 39 weeks gestation of pregnancy: Secondary | ICD-10-CM | POA: Diagnosis not present

## 2021-09-30 DIAGNOSIS — Z3A38 38 weeks gestation of pregnancy: Secondary | ICD-10-CM | POA: Diagnosis not present

## 2021-09-30 DIAGNOSIS — O99213 Obesity complicating pregnancy, third trimester: Secondary | ICD-10-CM | POA: Diagnosis not present

## 2021-10-08 DIAGNOSIS — Z8709 Personal history of other diseases of the respiratory system: Secondary | ICD-10-CM | POA: Diagnosis not present

## 2021-10-08 DIAGNOSIS — O134 Gestational [pregnancy-induced] hypertension without significant proteinuria, complicating childbirth: Secondary | ICD-10-CM | POA: Diagnosis not present

## 2021-10-08 DIAGNOSIS — O99824 Streptococcus B carrier state complicating childbirth: Secondary | ICD-10-CM | POA: Diagnosis not present

## 2021-10-08 DIAGNOSIS — O480 Post-term pregnancy: Secondary | ICD-10-CM | POA: Diagnosis not present

## 2021-10-08 DIAGNOSIS — Z3A40 40 weeks gestation of pregnancy: Secondary | ICD-10-CM | POA: Diagnosis not present

## 2021-10-08 DIAGNOSIS — O99214 Obesity complicating childbirth: Secondary | ICD-10-CM | POA: Diagnosis not present

## 2021-10-08 DIAGNOSIS — Z9889 Other specified postprocedural states: Secondary | ICD-10-CM | POA: Diagnosis not present

## 2021-10-08 DIAGNOSIS — O1415 Severe pre-eclampsia, complicating the puerperium: Secondary | ICD-10-CM | POA: Diagnosis not present

## 2021-10-09 DIAGNOSIS — Z3A40 40 weeks gestation of pregnancy: Secondary | ICD-10-CM | POA: Diagnosis not present

## 2021-10-10 DIAGNOSIS — O134 Gestational [pregnancy-induced] hypertension without significant proteinuria, complicating childbirth: Secondary | ICD-10-CM | POA: Diagnosis not present

## 2021-10-10 DIAGNOSIS — O4212 Full-term premature rupture of membranes, onset of labor more than 24 hours following rupture: Secondary | ICD-10-CM | POA: Diagnosis not present

## 2021-10-10 DIAGNOSIS — Z3A40 40 weeks gestation of pregnancy: Secondary | ICD-10-CM | POA: Diagnosis not present

## 2021-10-10 DIAGNOSIS — O99214 Obesity complicating childbirth: Secondary | ICD-10-CM | POA: Diagnosis not present

## 2021-10-10 DIAGNOSIS — O480 Post-term pregnancy: Secondary | ICD-10-CM | POA: Diagnosis not present

## 2021-10-21 DIAGNOSIS — R8290 Unspecified abnormal findings in urine: Secondary | ICD-10-CM | POA: Diagnosis not present

## 2021-10-21 DIAGNOSIS — D649 Anemia, unspecified: Secondary | ICD-10-CM | POA: Diagnosis not present

## 2021-10-21 DIAGNOSIS — J189 Pneumonia, unspecified organism: Secondary | ICD-10-CM | POA: Diagnosis not present

## 2021-10-21 DIAGNOSIS — O9081 Anemia of the puerperium: Secondary | ICD-10-CM | POA: Diagnosis not present

## 2021-10-21 DIAGNOSIS — O9089 Other complications of the puerperium, not elsewhere classified: Secondary | ICD-10-CM | POA: Diagnosis not present

## 2021-10-21 DIAGNOSIS — O99893 Other specified diseases and conditions complicating puerperium: Secondary | ICD-10-CM | POA: Diagnosis not present

## 2021-10-21 DIAGNOSIS — R000 Tachycardia, unspecified: Secondary | ICD-10-CM | POA: Diagnosis not present

## 2021-10-21 DIAGNOSIS — D6489 Other specified anemias: Secondary | ICD-10-CM | POA: Diagnosis not present

## 2021-10-21 DIAGNOSIS — I3481 Nonrheumatic mitral (valve) annulus calcification: Secondary | ICD-10-CM | POA: Diagnosis not present

## 2021-10-21 DIAGNOSIS — J45901 Unspecified asthma with (acute) exacerbation: Secondary | ICD-10-CM | POA: Diagnosis not present

## 2021-10-21 DIAGNOSIS — Z86711 Personal history of pulmonary embolism: Secondary | ICD-10-CM | POA: Diagnosis not present

## 2021-10-21 DIAGNOSIS — O135 Gestational [pregnancy-induced] hypertension without significant proteinuria, complicating the puerperium: Secondary | ICD-10-CM | POA: Diagnosis not present

## 2021-10-21 DIAGNOSIS — J180 Bronchopneumonia, unspecified organism: Secondary | ICD-10-CM | POA: Diagnosis not present

## 2021-10-21 DIAGNOSIS — R062 Wheezing: Secondary | ICD-10-CM | POA: Diagnosis not present

## 2021-10-21 DIAGNOSIS — D72829 Elevated white blood cell count, unspecified: Secondary | ICD-10-CM | POA: Diagnosis not present

## 2021-10-21 DIAGNOSIS — O9953 Diseases of the respiratory system complicating the puerperium: Secondary | ICD-10-CM | POA: Diagnosis not present

## 2021-10-21 DIAGNOSIS — R0600 Dyspnea, unspecified: Secondary | ICD-10-CM | POA: Diagnosis not present

## 2021-10-21 DIAGNOSIS — O99215 Obesity complicating the puerperium: Secondary | ICD-10-CM | POA: Diagnosis not present

## 2021-10-21 DIAGNOSIS — Z20822 Contact with and (suspected) exposure to covid-19: Secondary | ICD-10-CM | POA: Diagnosis not present

## 2021-10-21 DIAGNOSIS — J4521 Mild intermittent asthma with (acute) exacerbation: Secondary | ICD-10-CM | POA: Diagnosis not present

## 2021-10-21 DIAGNOSIS — Z79899 Other long term (current) drug therapy: Secondary | ICD-10-CM | POA: Diagnosis not present

## 2021-10-21 DIAGNOSIS — R0789 Other chest pain: Secondary | ICD-10-CM | POA: Diagnosis not present

## 2021-10-21 DIAGNOSIS — R0682 Tachypnea, not elsewhere classified: Secondary | ICD-10-CM | POA: Diagnosis not present

## 2021-10-21 DIAGNOSIS — I517 Cardiomegaly: Secondary | ICD-10-CM | POA: Diagnosis not present

## 2021-10-21 DIAGNOSIS — R0602 Shortness of breath: Secondary | ICD-10-CM | POA: Diagnosis not present

## 2021-10-21 DIAGNOSIS — O165 Unspecified maternal hypertension, complicating the puerperium: Secondary | ICD-10-CM | POA: Diagnosis not present

## 2021-10-21 DIAGNOSIS — Z6841 Body Mass Index (BMI) 40.0 and over, adult: Secondary | ICD-10-CM | POA: Diagnosis not present

## 2021-10-22 DIAGNOSIS — D649 Anemia, unspecified: Secondary | ICD-10-CM | POA: Diagnosis not present

## 2021-10-22 DIAGNOSIS — R0682 Tachypnea, not elsewhere classified: Secondary | ICD-10-CM | POA: Diagnosis not present

## 2021-10-22 DIAGNOSIS — R000 Tachycardia, unspecified: Secondary | ICD-10-CM | POA: Diagnosis not present

## 2021-10-22 DIAGNOSIS — J180 Bronchopneumonia, unspecified organism: Secondary | ICD-10-CM | POA: Diagnosis not present

## 2021-10-22 DIAGNOSIS — J4521 Mild intermittent asthma with (acute) exacerbation: Secondary | ICD-10-CM | POA: Diagnosis not present

## 2021-10-22 DIAGNOSIS — O9953 Diseases of the respiratory system complicating the puerperium: Secondary | ICD-10-CM | POA: Diagnosis not present

## 2021-10-22 DIAGNOSIS — R8290 Unspecified abnormal findings in urine: Secondary | ICD-10-CM | POA: Diagnosis not present

## 2021-10-22 DIAGNOSIS — J45901 Unspecified asthma with (acute) exacerbation: Secondary | ICD-10-CM | POA: Diagnosis not present

## 2021-10-22 DIAGNOSIS — D72829 Elevated white blood cell count, unspecified: Secondary | ICD-10-CM | POA: Diagnosis not present

## 2021-10-22 DIAGNOSIS — Z6841 Body Mass Index (BMI) 40.0 and over, adult: Secondary | ICD-10-CM | POA: Diagnosis not present

## 2021-10-22 DIAGNOSIS — O99215 Obesity complicating the puerperium: Secondary | ICD-10-CM | POA: Diagnosis not present

## 2021-10-22 DIAGNOSIS — O135 Gestational [pregnancy-induced] hypertension without significant proteinuria, complicating the puerperium: Secondary | ICD-10-CM | POA: Diagnosis not present
